# Patient Record
Sex: Female | Born: 1960 | Race: Black or African American | Hispanic: No | Marital: Single | Smoking: Never smoker
Health system: Southern US, Community
[De-identification: ages and names within clinical notes are randomized; demographics above are authoritative.]

## PROBLEM LIST (undated history)

## (undated) DIAGNOSIS — J9621 Acute and chronic respiratory failure with hypoxia: Secondary | ICD-10-CM

## (undated) DIAGNOSIS — G4733 Obstructive sleep apnea (adult) (pediatric): Secondary | ICD-10-CM

## (undated) DIAGNOSIS — Z8679 Personal history of other diseases of the circulatory system: Secondary | ICD-10-CM

## (undated) DIAGNOSIS — G8250 Quadriplegia, unspecified: Secondary | ICD-10-CM

## (undated) DIAGNOSIS — I5032 Chronic diastolic (congestive) heart failure: Secondary | ICD-10-CM

## (undated) HISTORY — PX: TRACHEOSTOMY: SUR1362

## (undated) HISTORY — DX: Obstructive sleep apnea (adult) (pediatric): G47.33

## (undated) HISTORY — DX: Morbid (severe) obesity due to excess calories: E66.01

## (undated) HISTORY — PX: PEG TUBE PLACEMENT: SUR1034

## (undated) HISTORY — DX: Chronic diastolic (congestive) heart failure: I50.32

## (undated) HISTORY — DX: Quadriplegia, unspecified: G82.50

## (undated) HISTORY — DX: Acute and chronic respiratory failure with hypoxia: J96.21

---

## 2018-07-25 ENCOUNTER — Encounter: Payer: Self-pay | Admitting: Internal Medicine

## 2018-07-25 ENCOUNTER — Other Ambulatory Visit: Payer: Self-pay | Admitting: Internal Medicine

## 2018-07-25 DIAGNOSIS — I5032 Chronic diastolic (congestive) heart failure: Secondary | ICD-10-CM

## 2018-07-25 DIAGNOSIS — G4733 Obstructive sleep apnea (adult) (pediatric): Secondary | ICD-10-CM | POA: Insufficient documentation

## 2018-07-25 DIAGNOSIS — J9621 Acute and chronic respiratory failure with hypoxia: Secondary | ICD-10-CM | POA: Insufficient documentation

## 2018-07-25 DIAGNOSIS — Z8679 Personal history of other diseases of the circulatory system: Secondary | ICD-10-CM | POA: Insufficient documentation

## 2018-07-25 DIAGNOSIS — G8250 Quadriplegia, unspecified: Secondary | ICD-10-CM

## 2018-07-25 NOTE — Progress Notes (Signed)
[facility]  [facility]  Date of Service: 07/25/2018  PULMONARY CONSULT   Pamela George  JWJ:[id number]  DOB: 08/05/1960     Referring Physician: Larena GlassmanAmir Firozvi, MD  HPI: Pamela George is a 57 y.o. female seen for Acute on Chronic Respiratory Failure.  Patient is transferred here for further management and weaning.  Right now is on full vent support.  Patient has multiple medical problems including history of type 2 diabetes GERD hypertension lymphedema diastolic heart failure obstructive sleep apnea presented to the hospital with increasing weakness of her upper extremities.  Patient had a CT scan done which showed spinal stenosis from degenerative disc disease.  Patient had paraparesis at that time.  Surgery was asked to see the patient and they deemed her as not a surgical candidate.  Patient was moved to the ICU because of respiratory compromise intubated and eventually a tracheostomy was placed.  She is as a result consistently paraplegic and has been on vent dependent.  Patient was not able to wean and subsequently had a tracheostomy done.  The patient is awake although nonverbal.   Past medical history: Type 2 diabetes GERD Peptic ulcer disease GI bleed Hypertension Lymphedema Chronic diastolic heart failure Obstructive sleep apnea with obesity hypoventilation  Past surgical history: Colostomy Tonsillectomy Adenoidectomy Knee arthroplastic surgery Bowel resection  Allergies Zosyn Penicillin  Social history currently does not smoke or drink or use any drugs  Family history: Unknown  Review of Systems:  ROS performed and is unremarkable other than noted above.  Medications: Reviewed on Rounds  Physical Exam:  Vitals: Temperature 97.2 pulse 86 respiratory 18 blood pressure 122/71 saturations 98%  Ventilator Settings mode of ventilation assist control FiO2 30% tidal volume 408 PEEP 5  . General: Comfortable at this time . Eyes:  Grossly normal lids, irises & conjunctiva . ENT: grossly tongue is normal . Neck: no obvious mass . Cardiovascular: S1-S2 normal no gallop or rub . Respiratory: No rhonchi no rales . Abdomen: Soft and nontender . Skin: no rash seen on limited exam . Musculoskeletal: not rigid . Psychiatric:unable to assess . Neurologic: no seizure no involuntary movements         Labs on Admission:  Laboratory data sodium 150 potassium 3.5 BUN 22 creatinine 0.4 glucose 144 White count 11.2 hemoglobin 11 hematocrit 37.3 platelet count 334 ABG pH 7.537 PCO2 50.3 PO2 86  Radiological Exams on Admission: Radiological studies were reviewed  Assessment/Plan Patient Active Problem List   Diagnosis Date Noted  . Acute on chronic respiratory failure with hypoxia (HCC)   . Quadriplegia and quadriparesis (HCC)   . Obstructive sleep apnea   . Chronic diastolic heart failure (HCC)   . Morbid obesity (HCC)      1. Acute on chronic respiratory failure with hypoxia patient is going to be somewhat of a difficult candidate for weaning because of the cervical disc lesion and we are going to need to check the RSB I and mechanics daily to see if she will be a candidate for T-bar trials.  Spoke with respiratory therapy on rounds and they will assess 2. Quadriplegia acute we will have rehab take a look at her and see if she is a candidate for therapy. 3. Morbid obesity dietary management 4. Obstructive sleep apnea currently is a nonissue because of tracheostomy being in place 5. Chronic diastolic heart failure right now is compensated we will continue with supportive care  I have personally seen and evaluated the patient, evaluated laboratory and  imaging results, formulated the assessment and plan and placed orders. The Patient requires high complexity decision making for assessment and support.  Case was discussed on Rounds with the Respiratory Therapy Staff Time Spent 70minutes  Yevonne PaxSaadat A Fallynn Gravett, MD [facility] Pulmonary  Critical Care Medicine [facility]

## 2018-07-26 ENCOUNTER — Other Ambulatory Visit: Payer: Self-pay | Admitting: Internal Medicine

## 2018-07-26 DIAGNOSIS — G8250 Quadriplegia, unspecified: Secondary | ICD-10-CM

## 2018-07-26 DIAGNOSIS — G4733 Obstructive sleep apnea (adult) (pediatric): Secondary | ICD-10-CM

## 2018-07-26 DIAGNOSIS — J9621 Acute and chronic respiratory failure with hypoxia: Secondary | ICD-10-CM

## 2018-07-26 DIAGNOSIS — I5032 Chronic diastolic (congestive) heart failure: Secondary | ICD-10-CM

## 2018-07-26 NOTE — Progress Notes (Signed)
[facility]  [facility]GRESS NOTE  PULMONARY SERVICE ROUNDS  Date of Service: 07/26/2018  Pamela Museeborah George  DOB: 10/29/1960  Referring physician: Larena GlassmanAmir Firozvi, MD  HPI: Pamela George is a 57 y.o. female  being seen for Acute on Chronic Respiratory Failure.  Patient is comfortable at this time no distress.  The patient remains on the ventilator and full support.  Patient had a RSB I done which was poor and so therefore patient was not attempted on the wean today.  Respiratory therapy is going to reassess later in the afternoon if it is feasible then will be started on the wean.  Review of Systems: Unremarkable other than noted in HPI  All[facility]ies:  [facility]  Medications: Reviewed  Vitals: Temperature 98.9 pulse 89 respiratory rate 27 blood pressure 160/85 saturations 85%  Ventilator Settings: Mode of ventilation assist control FiO2 30% tidal volume 398 PEEP 5  Physical Exam: . General:  calm and comfortable NAD . Eyes: normal lids, irises & conjunctiva . ENT: grossly normal tongue not enlarged . Neck: no masses . Cardiovascular: S1 S2 Normal no rubs no gallop . Respiratory: Scattered rhonchi expansion is equal at this time. . Abdomen: soft non-distended . Skin: no rash seen on limited exam . Musculoskeletal:  no rigidity . Psychiatric: unable to assess . Neurologic: no involuntary movements          Lab Data and radiological Data:  Sodium 143 potassium 3.3 BUN 19 creatinine 0.3  Chest x-ray showed some basilar atelectasis no acute infiltrates   Assessment/Plan  Patient Active Problem List   Diagnosis Date Noted  . Acute on chronic respiratory failure with hypoxia (HCC)   . Quadriplegia and quadriparesis (HCC)   . Obstructive sleep apnea   . Chronic diastolic heart failure (HCC)   . Morbid obesity (HCC)       1. Acute on chronic respiratory failure with hypoxia we will continue to check the respiratory mechanics at this time the patient's  RSB I has been poor and so therefore has not been tolerating the weaning will continue with assist control for now. 2. Quadriplegia quadriparesis therapy as tolerated continue to monitor. 3. Chronic diastolic heart failure compensated right now follow-up x-rays as necessary monitor fluid status 4. Obstructive sleep apnea has tracheostomy in place 5. Morbid obesity at baseline we will continue with supportive care   I have personally evaluated the patient, evaluated the laboratory and imaging results and formulated the assessment and plan and placed orders as needed. The Patient requires high complexity decision making for assessment and support. I have discussed the patient on rounds with the Respiratory Staff   Yevonne PaxSaadat A Khan, MD [facility] Pulmonary Critical Care Medicine

## 2018-07-27 ENCOUNTER — Other Ambulatory Visit: Payer: Self-pay | Admitting: Internal Medicine

## 2018-07-27 DIAGNOSIS — G8250 Quadriplegia, unspecified: Secondary | ICD-10-CM

## 2018-07-27 DIAGNOSIS — J9621 Acute and chronic respiratory failure with hypoxia: Secondary | ICD-10-CM

## 2018-07-27 DIAGNOSIS — G4733 Obstructive sleep apnea (adult) (pediatric): Secondary | ICD-10-CM

## 2018-07-27 DIAGNOSIS — I5032 Chronic diastolic (congestive) heart failure: Secondary | ICD-10-CM

## 2018-07-27 NOTE — Progress Notes (Signed)
[facility]  PROGRESS NOTE  PULMONARY SERVICE ROUNDS  Date of Service: 07/27/2018  Pamela Museeborah George  DOB: 04/17/1961  Referring physician: Larena GlassmanAmir Firozvi, MD  HPI: Pamela MuseDeborah Paolo is a 57 y.o. female  being seen for Acute on Chronic Respiratory Failure.  Patient is resting comfortably right now without distress remains on full support.  Currently is on assist control mode  Review of Systems: Unremarkable other than noted in HPI  Allergies:  [facility]  Medications: Reviewed  Vitals: Temperature 98.4 pulse 84 respiratory rate 18 blood pressure 97/61 saturations 100%  Ventilator Settings: Mode ventilation assist control FiO2 35% tidal line 380 PEEP 5  Physical Exam: . General:  calm and comfortable NAD . Eyes: normal lids, irises & conjunctiva . ENT: grossly normal tongue not enlarged . Neck: no masses . Cardiovascular: S1 S2 Normal no rubs no gallop . Respiratory: No rhonchi no rales are noted . Abdomen: soft non-distended . Skin: no rash seen on limited exam . Musculoskeletal:  no rigidity . Psychiatric: unable to assess . Neurologic: no involuntary movements          Lab Data and radiological Data:  Data reviewed   Assessment/Plan  Patient Active Problem List   Diagnosis Date Noted  . Acute on chronic respiratory failure with hypoxia (HCC)   . Quadriplegia and quadriparesis (HCC)   . Obstructive sleep apnea   . Chronic diastolic heart failure (HCC)   . Morbid obesity (HCC)       1. Acute on chronic respiratory failure with hypoxia we will continue with T collar trials continue pulmonary toilet supportive care 2. Quadriplegia with quadriparesis unchanged we will continue supportive care 3. Obstructive sleep apnea not issue at this time 4. Chronic diastolic heart failure monitor fluid status 5. Morbid obesity at baseline we will continue with present management   I have personally evaluated the patient, evaluated the laboratory and  imaging results and formulated the assessment and plan and placed orders as needed. The Patient requires high complexity decision making for assessment and support. I have discussed the patient on rounds with the Respiratory Staff   Yevonne PaxSaadat A Khan, MD [facility] Pulmonary Critical Care Medicine

## 2018-07-28 ENCOUNTER — Other Ambulatory Visit (HOSPITAL_COMMUNITY): Payer: Medicare Other | Admitting: Internal Medicine

## 2018-07-28 DIAGNOSIS — G4733 Obstructive sleep apnea (adult) (pediatric): Secondary | ICD-10-CM | POA: Diagnosis not present

## 2018-07-28 DIAGNOSIS — I5032 Chronic diastolic (congestive) heart failure: Secondary | ICD-10-CM

## 2018-07-28 DIAGNOSIS — G8250 Quadriplegia, unspecified: Secondary | ICD-10-CM

## 2018-07-28 DIAGNOSIS — J9621 Acute and chronic respiratory failure with hypoxia: Secondary | ICD-10-CM

## 2018-07-28 NOTE — Progress Notes (Signed)
[facility]  PROGRESS NOTE  PULMONARY SERVICE ROUNDS  Date of Service: 07/28/2018  Pamela Museeborah George  DOB: 05/11/1961  Referring physician: Larena GlassmanAmir Firozvi, MD  HPI: Pamela George is a 57 y.o. female  being seen for Acute on Chronic Respiratory Failure.  Comfortable right now without distress.  Patient is on the ventilator assist control mode  Review of Systems: Unremarkable other than noted in HPI  Allergies:  [facility] the [facility]  Medications: Reviewed  Vitals: Pitcher 96.3 pulse 60 respiratory 14 blood pressure 120/66 saturations 100%  Ventilator Settings: Mode of ventilation assist control tidal volume 400 PEEP is 5  Physical Exam: . General:  calm and comfortable NAD . Eyes: normal lids, irises & conjunctiva . ENT: grossly normal tongue not enlarged . Neck: no masses . Cardiovascular: S1 S2 Normal no rubs no gallop . Respiratory: No rhonchi or rales are noted at this time . Abdomen: soft non-distended . Skin: no rash seen on limited exam . Musculoskeletal:  no rigidity . Psychiatric: unable to assess . Neurologic: no involuntary movements          Lab Data and radiological Data:  White count 6.8 hemoglobin 9.5 hematocrit 31.2 platelet count 259   Assessment/Plan  Patient Active Problem List   Diagnosis Date Noted  . Acute on chronic respiratory failure with hypoxia (HCC)   . Quadriplegia and quadriparesis (HCC)   . Obstructive sleep apnea   . Chronic diastolic heart failure (HCC)   . Morbid obesity (HCC)       1. Acute on chronic respiratory failure with hypoxia we will continue with the full vent support continue to check the breathing trials patient's trials have been failing right now.  We will continue to assess. 2. Quadriplegia supportive care therapy as tolerated 3. Sleep apnea nonissue 4. Chronic diastolic heart failure monitor fluid status 5. Morbid obesity at baseline we will continue with supportive care   I have personally  evaluated the patient, evaluated the laboratory and imaging results and formulated the assessment and plan and placed orders as needed. The Patient requires high complexity decision making for assessment and support. I have discussed the patient on rounds with the Respiratory Staff   Yevonne PaxSaadat A , MD [facility] Pulmonary Critical Care Medicine

## 2018-07-30 ENCOUNTER — Other Ambulatory Visit (HOSPITAL_COMMUNITY): Payer: Medicare Other | Admitting: Internal Medicine

## 2018-07-30 DIAGNOSIS — J9621 Acute and chronic respiratory failure with hypoxia: Secondary | ICD-10-CM

## 2018-07-30 DIAGNOSIS — G4733 Obstructive sleep apnea (adult) (pediatric): Secondary | ICD-10-CM

## 2018-07-30 DIAGNOSIS — I5032 Chronic diastolic (congestive) heart failure: Secondary | ICD-10-CM | POA: Diagnosis not present

## 2018-07-30 DIAGNOSIS — G8250 Quadriplegia, unspecified: Secondary | ICD-10-CM

## 2018-07-30 NOTE — Progress Notes (Signed)
[facility]  PROGRESS NOTE  PULMONARY SERVICE ROUNDS  Date of Service: 07/30/2018  Pamela George  DOB: 1960/08/20  Referring physician: Larena Glassman, MD  HPI: Pamela George is a 58 y.o. female  being seen for Acute on Chronic Respiratory Failure.  Patient is on full support right now on assist control mode the mechanics have been poor not able to tolerate weaning  Review of Systems: Unremarkable other than noted in HPI  Allergies:  Reviewed on the [facility]  Medications: Reviewed  Vitals: Temperature 98.0 pulse 67 respiratory 23 blood pressure 125/69 saturations 99%  Ventilator Settings: Mode of ventilation assist control FiO2 30% tidal volume 380 PEEP 5  Physical Exam: . General:  calm and comfortable NAD . Eyes: normal lids, irises & conjunctiva . ENT: grossly normal tongue not enlarged . Neck: no masses . Cardiovascular: S1 S2 Normal no rubs no gallop . Respiratory: No rhonchi or rales are noted at this time . Abdomen: soft non-distended . Skin: no rash seen on limited exam . Musculoskeletal:  no rigidity . Psychiatric: unable to assess . Neurologic: no involuntary movements          Lab Data and radiological Data:  Sodium 141 potassium 4.1 BUN 7 creatinine 0.3   Assessment/Plan  Patient Active Problem List   Diagnosis Date Noted  . Acute on chronic respiratory failure with hypoxia (HCC)   . Quadriplegia and quadriparesis (HCC)   . Obstructive sleep apnea   . Chronic diastolic heart failure (HCC)   . Morbid obesity (HCC)       1. Acute on chronic respiratory failure with hypoxia we will continue with assessing the mechanics patient's chances of weaning her fairly poor 2. Quadriplegia quadriparesis continue with supportive care. 3. Sleep apnea at baseline 4. Chronic diastolic heart failure at baseline with being on the ventilator 5. Morbid obesity at baseline we will continue with supportive care   I have personally evaluated the patient,  evaluated the laboratory and imaging results and formulated the assessment and plan and placed orders as needed. The Patient requires high complexity decision making for assessment and support. I have discussed the patient on rounds with the Respiratory Staff   Yevonne Pax, MD [facility] Pulmonary Critical Care Medicine

## 2018-07-31 ENCOUNTER — Other Ambulatory Visit (HOSPITAL_COMMUNITY): Payer: Medicare Other | Admitting: Internal Medicine

## 2018-07-31 DIAGNOSIS — I5032 Chronic diastolic (congestive) heart failure: Secondary | ICD-10-CM | POA: Diagnosis not present

## 2018-07-31 DIAGNOSIS — J9621 Acute and chronic respiratory failure with hypoxia: Secondary | ICD-10-CM

## 2018-07-31 DIAGNOSIS — G4733 Obstructive sleep apnea (adult) (pediatric): Secondary | ICD-10-CM

## 2018-07-31 DIAGNOSIS — G8250 Quadriplegia, unspecified: Secondary | ICD-10-CM

## 2018-07-31 NOTE — Progress Notes (Signed)
[facility]  PROGRESS NOTE  PULMONARY SERVICE ROUNDS  Date of Service: 07/31/2018  Pamela George  DOB: 08-17-1960  Referring physician: Larena Glassman, MD  HPI: Pamela George is a 58 y.o. female  being seen for Acute on Chronic Respiratory Failure.  She is awake comfortable without distress.  Spoke to her explained to her that we can try her on a spontaneous breathing trial and try to begin more aggressive weaning if she is able to tolerate.  Review of Systems: Unremarkable other than noted in HPI  Allergies:  Reviewed on the [facility]  Medications: Reviewed  Vitals: Temperature 97.8 pulse 72 respiratory rate 14 blood pressure 120/72 saturations 100%  Ventilator Settings: Mode of ventilation assist control FiO2 30% tidal volume 360 PEEP 5  Physical Exam: . General:  calm and comfortable NAD . Eyes: normal lids, irises & conjunctiva . ENT: grossly normal tongue not enlarged . Neck: no masses . Cardiovascular: S1 S2 Normal no rubs no gallop . Respiratory: No rhonchi or rales are noted at this time . Abdomen: soft non-distended . Skin: no rash seen on limited exam . Musculoskeletal:  no rigidity . Psychiatric: unable to assess . Neurologic: no involuntary movements          Lab Data and radiological Data:  White count 5.0 hemoglobin 10.8 hematocrit 35.1 platelet count 266 Sodium 135 potassium 4.3 BUN 9 creatinine 0.3   Assessment/Plan  Patient Active Problem List   Diagnosis Date Noted  . Acute on chronic respiratory failure with hypoxia (HCC)   . Quadriplegia and quadriparesis (HCC)   . Obstructive sleep apnea   . Chronic diastolic heart failure (HCC)   . Morbid obesity (HCC)       1. Acute on chronic respiratory failure with hypoxia we will continue with resting mode of assist control however we are going to place her on spontaneous breathing trial today and try to advance weaning on T-bar trials if possible. 2. Quadriplegia quadriparesis we will  continue with supportive care she is inoperable 3. Obstructive sleep apnea nonissue 4. Chronic diastolic heart failure monitor fluid status. 5. Morbid obesity continue with present management   I have personally evaluated the patient, evaluated the laboratory and imaging results and formulated the assessment and plan and placed orders as needed. The Patient requires high complexity decision making for assessment and support. I have discussed the patient on rounds with the Respiratory Staff   Yevonne Pax, MD [facility] Pulmonary Critical Care Medicine

## 2018-08-01 ENCOUNTER — Other Ambulatory Visit (HOSPITAL_COMMUNITY): Payer: Medicare Other | Admitting: Internal Medicine

## 2018-08-01 DIAGNOSIS — I5032 Chronic diastolic (congestive) heart failure: Secondary | ICD-10-CM

## 2018-08-01 DIAGNOSIS — G4733 Obstructive sleep apnea (adult) (pediatric): Secondary | ICD-10-CM | POA: Diagnosis not present

## 2018-08-01 DIAGNOSIS — J9621 Acute and chronic respiratory failure with hypoxia: Secondary | ICD-10-CM | POA: Diagnosis not present

## 2018-08-01 DIAGNOSIS — G8250 Quadriplegia, unspecified: Secondary | ICD-10-CM

## 2018-08-01 NOTE — Progress Notes (Signed)
[facility]  PROGRESS NOTE  PULMONARY SERVICE ROUNDS  Date of Service: 08/01/2018  Pamela George  DOB: 1960-11-23  Referring physician: Larena Glassman, MD  HPI: Pamela George is a 58 y.o. female  being seen for Acute on Chronic Respiratory Failure.  She is doing well is been weaning on pressure support 10/5 spoke with respiratory therapy and we are going to place her on T collar trial today  Review of Systems: Unremarkable other than noted in HPI  Allergies:  Reviewed on the [facility]  Medications: Reviewed  Vitals: Temperature 98.7 pulse 65 respiratory 16 saturations 98%  Ventilator Settings: Mode of ventilation pressure support FiO2 30% tidal volume 390 PEEP 5 pressure support 10  Physical Exam: . General:  calm and comfortable NAD . Eyes: normal lids, irises & conjunctiva . ENT: grossly normal tongue not enlarged . Neck: no masses . Cardiovascular: S1 S2 Normal no rubs no gallop . Respiratory: Scattered rhonchi expansion is equal . Abdomen: soft non-distended . Skin: no rash seen on limited exam . Musculoskeletal:  no rigidity . Psychiatric: unable to assess . Neurologic: no involuntary movements          Lab Data and radiological Data:  No labs to report today   Assessment/Plan  Patient Active Problem List   Diagnosis Date Noted  . Acute on chronic respiratory failure with hypoxia (HCC)   . Quadriplegia and quadriparesis (HCC)   . Obstructive sleep apnea   . Chronic diastolic heart failure (HCC)   . Morbid obesity (HCC)       1. Acute on chronic respiratory failure with hypoxia we will continue with the pressure support wean on pressure support 10 PEEP 5 and then begin the T collar wean as discussed during rounds. 2. Quadriplegia quadriparesis unchanged we will continue with supportive care 3. Sleep apnea nonissue 4. Chronic diastolic heart failure currently is compensated 5. Morbid obesity we will continue with supportive care dietary  management   I have personally evaluated the patient, evaluated the laboratory and imaging results and formulated the assessment and plan and placed orders as needed. The Patient requires high complexity decision making for assessment and support. I have discussed the patient on rounds with the Respiratory Staff   Yevonne Pax, MD [facility] Pulmonary Critical Care Medicine

## 2018-08-03 ENCOUNTER — Other Ambulatory Visit (HOSPITAL_COMMUNITY): Payer: Medicare Other | Admitting: Internal Medicine

## 2018-08-03 DIAGNOSIS — G4733 Obstructive sleep apnea (adult) (pediatric): Secondary | ICD-10-CM

## 2018-08-03 DIAGNOSIS — J9621 Acute and chronic respiratory failure with hypoxia: Secondary | ICD-10-CM | POA: Diagnosis not present

## 2018-08-03 DIAGNOSIS — I5032 Chronic diastolic (congestive) heart failure: Secondary | ICD-10-CM

## 2018-08-03 DIAGNOSIS — G8250 Quadriplegia, unspecified: Secondary | ICD-10-CM

## 2018-08-03 NOTE — Progress Notes (Signed)
[facility]  PROGRESS NOTE  PULMONARY SERVICE ROUNDS  Date of Service: 08/03/2018  Pamela George  DOB: 1961-04-06  Referring physician: Larena Glassman, MD  HPI: Pamela George is a 58 y.o. female  being seen for Acute on Chronic Respiratory Failure.  She has been tolerating her weaning so far she is back on T collar this morning she was requiring 30% FiO2  Review of Systems: Unremarkable other than noted in HPI  Allergies:  Reviewed on the [facility]  Medications: Reviewed  Vitals: Temperature 97.8 pulse 73 respiratory rate 14 blood pressure 110/80 saturations 100%  Ventilator Settings: Off the ventilator on T collar currently  Physical Exam: . General:  calm and comfortable NAD . Eyes: normal lids, irises & conjunctiva . ENT: grossly normal tongue not enlarged . Neck: no masses . Cardiovascular: S1 S2 Normal no rubs no gallop . Respiratory: Scattered rhonchi expansion is equal . Abdomen: soft non-distended . Skin: no rash seen on limited exam . Musculoskeletal:  no rigidity . Psychiatric: unable to assess . Neurologic: no involuntary movements          Lab Data and radiological Data:  No labs to report today   Assessment/Plan  Patient Active Problem List   Diagnosis Date Noted  . Acute on chronic respiratory failure with hypoxia (HCC)   . Quadriplegia and quadriparesis (HCC)   . Obstructive sleep apnea   . Chronic diastolic heart failure (HCC)   . Morbid obesity (HCC)       1. Acute on chronic respiratory failure with hypoxia we will continue with T collar weaning as ordered per protocol.  Continue pulmonary toilet supportive care 2. Quadriplegia at baseline 3. Obstructive sleep apnea has trach in place 4. Chronic diastolic heart failure currently is compensated 5. We will obesity at baseline   I have personally evaluated the patient, evaluated the laboratory and imaging results and formulated the assessment and plan and placed orders as  needed. The Patient requires high complexity decision making for assessment and support. I have discussed the patient on rounds with the Respiratory Staff   Yevonne Pax, MD [facility] Pulmonary Critical Care Medicine

## 2018-08-04 ENCOUNTER — Other Ambulatory Visit (HOSPITAL_COMMUNITY): Payer: Medicare Other | Admitting: Internal Medicine

## 2018-08-04 DIAGNOSIS — G8250 Quadriplegia, unspecified: Secondary | ICD-10-CM

## 2018-08-04 DIAGNOSIS — J9621 Acute and chronic respiratory failure with hypoxia: Secondary | ICD-10-CM

## 2018-08-04 DIAGNOSIS — G4733 Obstructive sleep apnea (adult) (pediatric): Secondary | ICD-10-CM

## 2018-08-04 DIAGNOSIS — I5032 Chronic diastolic (congestive) heart failure: Secondary | ICD-10-CM | POA: Diagnosis not present

## 2018-08-04 NOTE — Progress Notes (Signed)
[facility]  PROGRESS NOTE  PULMONARY SERVICE ROUNDS  Date of Service: 08/04/2018  Pamela George  DOB: Aug 25, 1960  Referring physician: Larena Glassman, MD  HPI: Pamela George is a 58 y.o. female  being seen for Acute on Chronic Respiratory Failure.  Comfortable right now without distress remains on T collar the goal is weaning for 24 hours doing well so far  Review of Systems: Unremarkable other than noted in HPI  Allergies:  Reviewed on the [facility]  Medications: Reviewed  Vitals: Temperature 97.4 pulse 76 respiratory 14 blood pressure 109/59 saturations 99%  Ventilator Settings: Off the ventilator on T collar currently  Physical Exam: . General:  calm and comfortable NAD . Eyes: normal lids, irises & conjunctiva . ENT: grossly normal tongue not enlarged . Neck: no masses . Cardiovascular: S1 S2 Normal no rubs no gallop . Respiratory: No rhonchi no rales . Abdomen: soft non-distended . Skin: no rash seen on limited exam . Musculoskeletal:  no rigidity . Psychiatric: unable to assess . Neurologic: no involuntary movements          Lab Data and radiological Data:  No labs today   Assessment/Plan  Patient Active Problem List   Diagnosis Date Noted  . Acute on chronic respiratory failure with hypoxia (HCC)   . Quadriplegia and quadriparesis (HCC)   . Obstructive sleep apnea   . Chronic diastolic heart failure (HCC)   . Morbid obesity (HCC)       1. Acute on chronic respiratory failure with hypoxia we will continue to wean 24-hour goal on T collar. 2. Quadriplegia at baseline we will continue with supportive care therapy as tolerated 3. Obstructive sleep apnea has tracheostomy in place 4. Chronic diastolic heart failure compensated 5. Morbid obesity stable at this time   I have personally evaluated the patient, evaluated the laboratory and imaging results and formulated the assessment and plan and placed orders as needed. The Patient requires  high complexity decision making for assessment and support. I have discussed the patient on rounds with the Respiratory Staff   Yevonne Pax, MD [facility] Pulmonary Critical Care Medicine

## 2018-08-05 ENCOUNTER — Other Ambulatory Visit (HOSPITAL_COMMUNITY): Payer: Medicare Other | Admitting: Internal Medicine

## 2018-08-05 DIAGNOSIS — J9621 Acute and chronic respiratory failure with hypoxia: Secondary | ICD-10-CM | POA: Diagnosis not present

## 2018-08-05 DIAGNOSIS — G8250 Quadriplegia, unspecified: Secondary | ICD-10-CM

## 2018-08-05 DIAGNOSIS — I5032 Chronic diastolic (congestive) heart failure: Secondary | ICD-10-CM

## 2018-08-05 DIAGNOSIS — G4733 Obstructive sleep apnea (adult) (pediatric): Secondary | ICD-10-CM

## 2018-08-05 NOTE — Progress Notes (Signed)
[facility]  PROGRESS NOTE  PULMONARY SERVICE ROUNDS  Date of Service: 08/05/2018  [name]  DOB: [birth year]  Referring physician: Larena Glassman, MD  HPI: Pamela George is a 58 y.o. female  being seen for Acute on Chronic Respiratory Failure.  Patient is comfortable right now without distress is on assist control mode currently on 35% FiO2.  Overnight apparently patient was placed back on the ventilator had completed 18 hours based on this center level of injury she may require some form of nocturnal vent support for we will see how she does as we continue to wean  Review of Systems: Unremarkable other than noted in HPI  Allergies:  Reviewed on the [facility]  Medications: Reviewed  Vitals: Temperature 98.2 pulse 86 respiratory 14 blood pressure 118/69 saturations 99%  Ventilator Settings: Mode of ventilation assist control FiO2 35% tidal volume 427 PEEP 5  Physical Exam: . General:  calm and comfortable NAD . Eyes: normal lids, irises & conjunctiva . ENT: grossly normal tongue not enlarged . Neck: no masses . Cardiovascular: S1 S2 Normal no rubs no gallop . Respiratory: No rhonchi or rales are noted . Abdomen: soft non-distended . Skin: no rash seen on limited exam . Musculoskeletal:  no rigidity . Psychiatric: unable to assess . Neurologic: no involuntary movements          Lab Data and radiological Data:  White count 10.5 hemoglobin 11.2 hematocrit 38.1 platelet count 427 Sodium 138 potassium 4.5 BUN 11 creatinine 0.3   Assessment/Plan  Patient Active Problem List   Diagnosis Date Noted  . Acute on chronic respiratory failure with hypoxia (HCC)   . Quadriplegia and quadriparesis (HCC)   . Obstructive sleep apnea   . Chronic diastolic heart failure (HCC)   . Morbid obesity (HCC)       1. Acute on chronic respiratory failure with hypoxia we will continue with weaning on assist control mode right now and will switch her over to check the RSB I  then try to resume the T collar as she was tolerating it fairly well. 2. Quadriplegia quadriparesis we will continue with supportive care. 3. Obstructive sleep apnea nonissue 4. Chronic diastolic heart failure at baseline 5. Morbid obesity we will continue supportive care   I have personally evaluated the patient, evaluated the laboratory and imaging results and formulated the assessment and plan and placed orders as needed. The Patient requires high complexity decision making for assessment and support. I have discussed the patient on rounds with the Respiratory Staff   Yevonne Pax, MD [facility] Pulmonary Critical Care Medicine

## 2018-08-06 ENCOUNTER — Other Ambulatory Visit (HOSPITAL_COMMUNITY): Payer: Medicare Other | Admitting: Internal Medicine

## 2018-08-06 DIAGNOSIS — I5032 Chronic diastolic (congestive) heart failure: Secondary | ICD-10-CM

## 2018-08-06 DIAGNOSIS — G4733 Obstructive sleep apnea (adult) (pediatric): Secondary | ICD-10-CM | POA: Diagnosis not present

## 2018-08-06 DIAGNOSIS — G8250 Quadriplegia, unspecified: Secondary | ICD-10-CM

## 2018-08-06 DIAGNOSIS — J9621 Acute and chronic respiratory failure with hypoxia: Secondary | ICD-10-CM

## 2018-08-06 NOTE — Progress Notes (Signed)
[facility]  PROGRESS NOTE  PULMONARY SERVICE ROUNDS  Date of Service: 08/06/2018  Pamela George  DOB: 01/01/1961  Referring physician: Larena Glassman, MD  HPI: Pamela George is a 58 y.o. female  being seen for Acute on Chronic Respiratory Failure.  This morning the patient was on the ventilator the RSB I was poor she apparently has fatigue significantly since her last 18-hour wean on T collar  Review of Systems: Unremarkable other than noted in HPI  Allergies:  Reviewed on the [facility]  Medications: Reviewed  Vitals: Temperature 97.9 pulse 82 respiratory rate 16 blood pressure 134/68 saturations 100%  Ventilator Settings: Mode of ventilation assist control FiO2 35% PEEP 5  Physical Exam: . General:  calm and comfortable NAD . Eyes: normal lids, irises & conjunctiva . ENT: grossly normal tongue not enlarged . Neck: no masses . Cardiovascular: S1 S2 Normal no rubs no gallop . Respiratory: No rhonchi or rales are noted at this time . Abdomen: soft non-distended . Skin: no rash seen on limited exam . Musculoskeletal:  no rigidity . Psychiatric: unable to assess . Neurologic: no involuntary movements          Lab Data and radiological Data:  No labs to report today   Assessment/Plan  Patient Active Problem List   Diagnosis Date Noted  . Acute on chronic respiratory failure with hypoxia (HCC)   . Quadriplegia and quadriparesis (HCC)   . Obstructive sleep apnea   . Chronic diastolic heart failure (HCC)   . Morbid obesity (HCC)       1. Acute on chronic respiratory failure with hypoxia we will continue with the full vent support at this time.  Respiratory therapy is going to check the RSB I see if patient is able to wean again. 2. Quadriplegia continue with supportive care because of the high lesion she may actually require nocturnal support as has been previously discussed 3. Structures sleep apnea nonissue with trach 4. Chronic diastolic heart  failure continue to monitor radiologically no recent chest x-ray would recommend getting a follow-up chest film 5. Morbid obesity at baseline we will continue present management   I have personally evaluated the patient, evaluated the laboratory and imaging results and formulated the assessment and plan and placed orders as needed. The Patient requires high complexity decision making for assessment and support. I have discussed the patient on rounds with the Respiratory Staff   Yevonne Pax, MD [facility] Pulmonary Critical Care Medicine

## 2018-08-07 ENCOUNTER — Other Ambulatory Visit (HOSPITAL_COMMUNITY): Payer: Medicare Other | Admitting: Internal Medicine

## 2018-08-07 DIAGNOSIS — J9621 Acute and chronic respiratory failure with hypoxia: Secondary | ICD-10-CM

## 2018-08-07 DIAGNOSIS — I5032 Chronic diastolic (congestive) heart failure: Secondary | ICD-10-CM

## 2018-08-07 DIAGNOSIS — G4733 Obstructive sleep apnea (adult) (pediatric): Secondary | ICD-10-CM

## 2018-08-07 DIAGNOSIS — G8250 Quadriplegia, unspecified: Secondary | ICD-10-CM

## 2018-08-07 NOTE — Progress Notes (Signed)
[facility]  PROGRESS NOTE  PULMONARY SERVICE ROUNDS  Date of Service: 08/07/2018  Pamela George  DOB: [birth year]  Referring physician: Larena Glassman, MD  HPI: Pamela George is a 58 y.o. female  being seen for Acute on Chronic Respiratory Failure.  Patient right now is on full support on assist control mode has been on 30% FiO2 good saturations are noted  Review of Systems: Unremarkable other than noted in HPI  Allergies:  Reviewed on the [facility]  Medications: Reviewed  Vitals: Temperature 98.3 pulse 87 respiratory 14 blood pressure 162/90 saturations 98%  Ventilator Settings: Mode of ventilation assist control FiO2 30% tidal volume 396 PEEP 5  Physical Exam: . General:  calm and comfortable NAD . Eyes: normal lids, irises & conjunctiva . ENT: grossly normal tongue not enlarged . Neck: no masses . Cardiovascular: S1 S2 Normal no rubs no gallop . Respiratory: Coarse breath sounds no rhonchi . Abdomen: soft non-distended . Skin: no rash seen on limited exam . Musculoskeletal:  no rigidity . Psychiatric: unable to assess . Neurologic: no involuntary movements          Lab Data and radiological Data:  No labs to report today No recent chest x-ray done   Assessment/Plan  Patient Active Problem List   Diagnosis Date Noted  . Acute on chronic respiratory failure with hypoxia (HCC)   . Quadriplegia and quadriparesis (HCC)   . Obstructive sleep apnea   . Chronic diastolic heart failure (HCC)   . Morbid obesity (HCC)       1. Acute on chronic respiratory failure with hypoxia she had been tolerating the wean few days ago however since then her our SBI has been poor and that she has not been able to tolerate weaning at all.  I have recommended that we get a follow-up chest x-ray she has not had a chest x-ray since December 27.  Last chest x-ray had shown little bit of blunting of the left costophrenic angle 2. Quadriplegia we will continue with supportive  care physical therapy as tolerated she basically has no movement in her limbs at this point 3. Sleep apnea nonissue 4. Chronic diastolic heart failure as noted we will follow-up chest x-ray 5. Morbid obesity at baseline continue present management   I have personally evaluated the patient, evaluated the laboratory and imaging results and formulated the assessment and plan and placed orders as needed.  Time spent 35 minutes extended discussion with the primary care team The Patient requires high complexity decision making for assessment and support. I have discussed the patient on rounds with the Respiratory Staff   Yevonne Pax, MD [facility] Pulmonary Critical Care Medicine

## 2018-08-08 ENCOUNTER — Other Ambulatory Visit (HOSPITAL_COMMUNITY): Payer: Medicare Other | Admitting: Internal Medicine

## 2018-08-08 DIAGNOSIS — G4733 Obstructive sleep apnea (adult) (pediatric): Secondary | ICD-10-CM | POA: Diagnosis not present

## 2018-08-08 DIAGNOSIS — I5032 Chronic diastolic (congestive) heart failure: Secondary | ICD-10-CM | POA: Diagnosis not present

## 2018-08-08 DIAGNOSIS — G8250 Quadriplegia, unspecified: Secondary | ICD-10-CM

## 2018-08-08 DIAGNOSIS — J9621 Acute and chronic respiratory failure with hypoxia: Secondary | ICD-10-CM | POA: Diagnosis not present

## 2018-08-08 NOTE — Progress Notes (Signed)
[facility]  PROGRESS NOTE  PULMONARY SERVICE ROUNDS  Date of Service: 08/08/2018  Pamela George  DOB: 08-13-1960  Referring physician: Larena Glassman, MD  HPI: Pamela George is a 58 y.o. female  being seen for Acute on Chronic Respiratory Failure.  Patient is on full support.  Respiratory therapy is going to check her RSB I today  Review of Systems: Unremarkable other than noted in HPI  Allergies:  Reviewed on the [facility]  Medications: Reviewed  Vitals: Temperature 97.5 pulse 63 respiratory 14 blood pressure 130/80 saturations 100%  Ventilator Settings: On the ventilator for mode of ventilation assist control FiO2 30% tidal volume 392 PEEP 5  Physical Exam: . General:  calm and comfortable NAD . Eyes: normal lids, irises & conjunctiva . ENT: grossly normal tongue not enlarged . Neck: no masses . Cardiovascular: S1 S2 Normal no rubs no gallop . Respiratory: Scattered rhonchi expansion is equal . Abdomen: soft non-distended . Skin: no rash seen on limited exam . Musculoskeletal:  no rigidity . Psychiatric: unable to assess . Neurologic: no involuntary movements          Lab Data and radiological Data:  No labs to report   Assessment/Plan  Patient Active Problem List   Diagnosis Date Noted  . Acute on chronic respiratory failure with hypoxia (HCC)   . Quadriplegia and quadriparesis (HCC)   . Obstructive sleep apnea   . Chronic diastolic heart failure (HCC)   . Morbid obesity (HCC)       1. Acute on chronic respiratory failure with hypoxia we will check the RSB I today and try to begin weaning again right now is on assist control mode on 30% FiO2 she looks better today 2. Quadriplegia we will continue with supportive care 3. Obstructive sleep apnea nonissue 4. Chronic diastolic heart failure monitor fluids get follow-up chest x-ray is chest 5. Morbid obesity at baseline   I have personally evaluated the patient, evaluated the laboratory and  imaging results and formulated the assessment and plan and placed orders as needed. The Patient requires high complexity decision making for assessment and support. I have discussed the patient on rounds with the Respiratory Staff   Yevonne Pax, MD [facility] Pulmonary Critical Care Medicine

## 2018-08-09 ENCOUNTER — Other Ambulatory Visit (HOSPITAL_COMMUNITY): Payer: Medicare Other | Admitting: Internal Medicine

## 2018-08-09 DIAGNOSIS — G8250 Quadriplegia, unspecified: Secondary | ICD-10-CM

## 2018-08-09 DIAGNOSIS — G4733 Obstructive sleep apnea (adult) (pediatric): Secondary | ICD-10-CM | POA: Diagnosis not present

## 2018-08-09 DIAGNOSIS — J9621 Acute and chronic respiratory failure with hypoxia: Secondary | ICD-10-CM | POA: Diagnosis not present

## 2018-08-09 DIAGNOSIS — I5032 Chronic diastolic (congestive) heart failure: Secondary | ICD-10-CM | POA: Diagnosis not present

## 2018-08-09 NOTE — Progress Notes (Signed)
[facility]  PROGRESS NOTE  PULMONARY SERVICE ROUNDS  Date of Service: 08/09/2018  Pamela George  DOB: 1961-07-23  Referring physician: Larena Glassman, MD  HPI: Pamela George is a 58 y.o. female  being seen for Acute on Chronic Respiratory Failure.  Patient is on T collar today she is weaning yesterday she was not able to wean.  She does have issues with anxiety and therapist observed that as long as she gets her anxiety medications she seems to do better with the weaning on the T collar.  Review of Systems: Unremarkable other than noted in HPI  Allergies:  Reviewed on the [facility]  Medications: Reviewed  Vitals: Temperature 97.0 pulse 65 respiratory 14 blood pressure 120/80 saturations 99%  Ventilator Settings: Off the ventilator on T collar right now  Physical Exam: . General:  calm and comfortable NAD . Eyes: normal lids, irises & conjunctiva . ENT: grossly normal tongue not enlarged . Neck: no masses . Cardiovascular: S1 S2 Normal no rubs no gallop . Respiratory: No rhonchi or rales are noted at this time . Abdomen: soft non-distended . Skin: no rash seen on limited exam . Musculoskeletal:  no rigidity . Psychiatric: unable to assess . Neurologic: no involuntary movements          Lab Data and radiological Data:  No labs today   Assessment/Plan  Patient Active Problem List   Diagnosis Date Noted  . Acute on chronic respiratory failure with hypoxia (HCC)   . Quadriplegia and quadriparesis (HCC)   . Obstructive sleep apnea   . Chronic diastolic heart failure (HCC)   . Morbid obesity (HCC)       1. Acute on chronic respiratory failure with hypoxia patient currently is on T collar has been requiring 35% FiO2 we will continue with secretion management supportive care 2. Quadriplegia at baseline patient will be followed by therapy 3. Sleep apnea nonissue 4. Chronic diastolic heart failure at baseline 5. Morbid obesity continue with supportive  care dietary management   I have personally evaluated the patient, evaluated the laboratory and imaging results and formulated the assessment and plan and placed orders as needed. The Patient requires high complexity decision making for assessment and support. I have discussed the patient on rounds with the Respiratory Staff   Yevonne Pax, MD [facility] Pulmonary Critical Care Medicine

## 2018-08-15 ENCOUNTER — Other Ambulatory Visit (HOSPITAL_COMMUNITY): Payer: Medicare Other | Admitting: Internal Medicine

## 2018-08-15 DIAGNOSIS — G4733 Obstructive sleep apnea (adult) (pediatric): Secondary | ICD-10-CM

## 2018-08-15 DIAGNOSIS — J9621 Acute and chronic respiratory failure with hypoxia: Secondary | ICD-10-CM | POA: Diagnosis not present

## 2018-08-15 DIAGNOSIS — G8250 Quadriplegia, unspecified: Secondary | ICD-10-CM

## 2018-08-15 DIAGNOSIS — I5032 Chronic diastolic (congestive) heart failure: Secondary | ICD-10-CM

## 2018-08-15 NOTE — Progress Notes (Signed)
[facility]  PROGRESS NOTE  PULMONARY SERVICE ROUNDS  Date of Service: 08/15/2018  Pamela George  DOB: July 27, 1961  Referring physician: Larena Glassman, MD  HPI: Pamela George is a 58 y.o. female  being seen for Acute on Chronic Respiratory Failure.  Patient is on full support at this time she is failed the RSB I numerous times right now is on assist control mode on 25% FiO2  Review of Systems: Unremarkable other than noted in HPI  Allergies:  Reviewed on the [facility]  Medications: Reviewed  Vitals: Temperature 98.0 pulse 63 respiratory 20 blood pressure 152/89 saturations 98%  Ventilator Settings: Mode of ventilation assist control FiO2 25% tidal volume 4 2 PEEP 5  Physical Exam: . General:  calm and comfortable NAD . Eyes: normal lids, irises & conjunctiva . ENT: grossly normal tongue not enlarged . Neck: no masses . Cardiovascular: S1 S2 Normal no rubs no gallop . Respiratory: No rhonchi or rales are noted at this time . Abdomen: soft non-distended . Skin: no rash seen on limited exam . Musculoskeletal:  no rigidity . Psychiatric: unable to assess . Neurologic: no involuntary movements          Lab Data and radiological Data:  Data has been reviewed   Assessment/Plan  Patient Active Problem List   Diagnosis Date Noted  . Acute on chronic respiratory failure with hypoxia (HCC)   . Quadriplegia and quadriparesis (HCC)   . Obstructive sleep apnea   . Chronic diastolic heart failure (HCC)   . Morbid obesity (HCC)       1. Acute on chronic respiratory failure with hypoxia we will continue with full support on assist control titrate oxygen as tolerated more than likely patient is going to be vent dependent and not be able to be weaned off the ventilator. 2. Quadriplegia at baseline appears to be worsening 3. Chronic diastolic heart failure diuretics as tolerated 4. Morbid obesity stable 5. Sleep apnea nonissue at this time   I have personally  evaluated the patient, evaluated the laboratory and imaging results and formulated the assessment and plan and placed orders as needed. The Patient requires high complexity decision making for assessment and support. I have discussed the patient on rounds with the Respiratory Staff   Yevonne Pax, MD [facility] Pulmonary Critical Care Medicine

## 2018-08-16 ENCOUNTER — Other Ambulatory Visit (HOSPITAL_COMMUNITY): Payer: Medicare Other | Admitting: Internal Medicine

## 2018-08-16 DIAGNOSIS — J9621 Acute and chronic respiratory failure with hypoxia: Secondary | ICD-10-CM | POA: Diagnosis not present

## 2018-08-16 DIAGNOSIS — G8250 Quadriplegia, unspecified: Secondary | ICD-10-CM

## 2018-08-16 DIAGNOSIS — G4733 Obstructive sleep apnea (adult) (pediatric): Secondary | ICD-10-CM | POA: Diagnosis not present

## 2018-08-16 DIAGNOSIS — I5032 Chronic diastolic (congestive) heart failure: Secondary | ICD-10-CM | POA: Diagnosis not present

## 2018-08-16 NOTE — Progress Notes (Signed)
[facility]  PROGRESS NOTE  PULMONARY SERVICE ROUNDS  Date of Service: 08/16/2018  Pamela George  DOB: 09/20/1960  Referring physician: Larena Glassman, MD  HPI: Pamela George is a 58 y.o. female  being seen for Acute on Chronic Respiratory Failure.  Patient currently is resting comfortably she is on the ventilator and full support.  She is not been able to tolerate any weaning attempts over the course of last several days.  Right now is on full support assist control  Review of Systems: Unremarkable other than noted in HPI  Allergies:  Reviewed on the [facility]  Medications: Reviewed  Vitals: Temperature 98.0 pulse 90 respiratory rate 20 blood pressure 138/85 saturations 100%  Ventilator Settings: Mode of ventilation assist control FiO2 25% tidal volume 399 PEEP 5  Physical Exam: . General:  calm and comfortable NAD . Eyes: normal lids, irises & conjunctiva . ENT: grossly normal tongue not enlarged . Neck: no masses . Cardiovascular: S1 S2 Normal no rubs no gallop . Respiratory: Coarse breath sounds no rhonchi . Abdomen: soft non-distended . Skin: no rash seen on limited exam . Musculoskeletal:  no rigidity . Psychiatric: unable to assess . Neurologic: no involuntary movements          Lab Data and radiological Data:  No labs to report   Assessment/Plan  Patient Active Problem List   Diagnosis Date Noted  . Acute on chronic respiratory failure with hypoxia (HCC)   . Quadriplegia and quadriparesis (HCC)   . Obstructive sleep apnea   . Chronic diastolic heart failure (HCC)   . Morbid obesity (HCC)       1. Acute on chronic respiratory failure with hypoxia we will continue with full vent support and periodically continue to check the RSB I.  Unfortunately she is not likely to wean completely off the ventilator. 2. Quadriplegia progressive we will continue with supportive care. 3. Sleep apnea nonissue patient is on the vent 4. Chronic diastolic  heart failure continue with current management 5. Morbid obesity dietary management we will continue to monitor   I have personally evaluated the patient, evaluated the laboratory and imaging results and formulated the assessment and plan and placed orders as needed. The Patient requires high complexity decision making for assessment and support. I have discussed the patient on rounds with the Respiratory Staff   Yevonne Pax, MD [facility] Pulmonary Critical Care Medicine

## 2018-08-17 ENCOUNTER — Other Ambulatory Visit (HOSPITAL_COMMUNITY): Payer: Medicare Other | Admitting: Internal Medicine

## 2018-08-17 DIAGNOSIS — J9621 Acute and chronic respiratory failure with hypoxia: Secondary | ICD-10-CM

## 2018-08-17 DIAGNOSIS — G4733 Obstructive sleep apnea (adult) (pediatric): Secondary | ICD-10-CM | POA: Diagnosis not present

## 2018-08-17 DIAGNOSIS — I5032 Chronic diastolic (congestive) heart failure: Secondary | ICD-10-CM

## 2018-08-17 DIAGNOSIS — G8250 Quadriplegia, unspecified: Secondary | ICD-10-CM

## 2018-08-17 NOTE — Progress Notes (Signed)
[facility]  PROGRESS NOTE  PULMONARY SERVICE ROUNDS  Date of Service: 08/17/2018  Pamela George  DOB: 01-Oct-1960  Referring physician: Larena Glassman, MD  HPI: Pamela George is a 58 y.o. female  being seen for Acute on Chronic Respiratory Failure.  She remains on the ventilator and full support currently on assist control mode requiring 25% FiO2  Review of Systems: Unremarkable other than noted in HPI  Allergies:  Reviewed on the [facility]  Medications: Reviewed  Vitals: Temperature 97.1 pulse 79 respiratory rate 16 blood pressure 110/80 saturations 98%  Ventilator Settings: Mode ventilation assist control FiO2 25% tidal volume 393 PEEP 5  Physical Exam: . General:  calm and comfortable NAD . Eyes: normal lids, irises & conjunctiva . ENT: grossly normal tongue not enlarged . Neck: no masses . Cardiovascular: S1 S2 Normal no rubs no gallop . Respiratory: Scattered rhonchi noted . Abdomen: soft non-distended . Skin: no rash seen on limited exam . Musculoskeletal:  no rigidity . Psychiatric: unable to assess . Neurologic: no involuntary movements          Lab Data and radiological Data:  Sodium 136 potassium 3.9 BUN 12 creatinine 0.4 White count 5.3 hemoglobin 10.4 hematocrit 34.1 platelet count 232   Assessment/Plan  Patient Active Problem List   Diagnosis Date Noted  . Acute on chronic respiratory failure with hypoxia (HCC)   . Quadriplegia and quadriparesis (HCC)   . Obstructive sleep apnea   . Chronic diastolic heart failure (HCC)   . Morbid obesity (HCC)       1. Acute on chronic respiratory failure with hypoxia we will continue with the full vent support patient is now amenable. 2. Quadriplegia at baseline continue supportive care 3. OSA nonissue at this time 4. Chronic diastolic heart failure at baseline 5. Morbid obesity continue with supportive care   I have personally evaluated the patient, evaluated the laboratory and imaging  results and formulated the assessment and plan and placed orders as needed. The Patient requires high complexity decision making for assessment and support. I have discussed the patient on rounds with the Respiratory Staff   Yevonne Pax, MD [facility] Pulmonary Critical Care Medicine

## 2018-08-18 ENCOUNTER — Other Ambulatory Visit (HOSPITAL_COMMUNITY): Payer: Medicare Other | Admitting: Internal Medicine

## 2018-08-18 DIAGNOSIS — J9621 Acute and chronic respiratory failure with hypoxia: Secondary | ICD-10-CM

## 2018-08-18 DIAGNOSIS — I5032 Chronic diastolic (congestive) heart failure: Secondary | ICD-10-CM

## 2018-08-18 DIAGNOSIS — G8250 Quadriplegia, unspecified: Secondary | ICD-10-CM

## 2018-08-18 DIAGNOSIS — G4733 Obstructive sleep apnea (adult) (pediatric): Secondary | ICD-10-CM

## 2018-08-18 NOTE — Progress Notes (Signed)
[facility]  PROGRESS NOTE  PULMONARY SERVICE ROUNDS  Date of Service: 08/18/2018  Pamela George  DOB: October 06, 1960  Referring physician: Larena Glassman, MD  HPI: Pamela George is a 57 y.o. female  being seen for Acute on Chronic Respiratory Failure.  She is awake comfortable without distress remains in the cervical collar.  Review of Systems: Unremarkable other than noted in HPI  Allergies:  Reviewed on the [facility]  Medications: Reviewed  Vitals: Temperature 98 pulse 57 respiratory rate 18 blood pressure 158/83 saturations 100%  Ventilator Settings: Mode ventilation assist control FiO2 25% tidal volume 363 PEEP 5  Physical Exam: . General:  calm and comfortable NAD . Eyes: normal lids, irises & conjunctiva . ENT: grossly normal tongue not enlarged . Neck: no masses . Cardiovascular: S1 S2 Normal no rubs no gallop . Respiratory: No rhonchi or rales are noted at this time . Abdomen: soft non-distended . Skin: no rash seen on limited exam . Musculoskeletal:  no rigidity . Psychiatric: unable to assess . Neurologic: no involuntary movements          Lab Data and radiological Data:  No labs to report today   Assessment/Plan  Patient Active Problem List   Diagnosis Date Noted  . Acute on chronic respiratory failure with hypoxia (HCC)   . Quadriplegia and quadriparesis (HCC)   . Obstructive sleep apnea   . Chronic diastolic heart failure (HCC)   . Morbid obesity (HCC)       1. Acute on chronic respiratory failure with hypoxia we will continue with full vent support.  She is not going to be able to wean off the ventilator. 2. Quadriplegia at baseline 3. Struct of sleep apnea nonissue patient is on the ventilator 4. Chronic diastolic heart failure compensated we will continue with supportive care 5. Morbid obesity at baseline   I have personally evaluated the patient, evaluated the laboratory and imaging results and formulated the assessment and  plan and placed orders as needed. The Patient requires high complexity decision making for assessment and support. I have discussed the patient on rounds with the Respiratory Staff   Yevonne Pax, MD [facility] Pulmonary Critical Care Medicine

## 2018-12-18 ENCOUNTER — Emergency Department (HOSPITAL_COMMUNITY)
Admission: EM | Admit: 2018-12-18 | Discharge: 2018-12-18 | Disposition: A | Discharge status: Home / Self Care | Payer: Medicare Other | Attending: Emergency Medicine | Admitting: Emergency Medicine

## 2018-12-18 ENCOUNTER — Emergency Department (HOSPITAL_COMMUNITY): Payer: Medicare Other

## 2018-12-18 ENCOUNTER — Other Ambulatory Visit: Payer: Self-pay

## 2018-12-18 DIAGNOSIS — I5032 Chronic diastolic (congestive) heart failure: Secondary | ICD-10-CM | POA: Diagnosis not present

## 2018-12-18 DIAGNOSIS — N3000 Acute cystitis without hematuria: Secondary | ICD-10-CM | POA: Diagnosis not present

## 2018-12-18 DIAGNOSIS — J9500 Unspecified tracheostomy complication: Secondary | ICD-10-CM | POA: Diagnosis present

## 2018-12-18 LAB — POCT I-STAT 7, (LYTES, BLD GAS, ICA,H+H)
Acid-Base Excess: 8 mmol/L — ABNORMAL HIGH (ref 0.0–2.0)
Bicarbonate: 34.8 mmol/L — ABNORMAL HIGH (ref 20.0–28.0)
Calcium, Ion: 1.29 mmol/L (ref 1.15–1.40)
HCT: 37 % (ref 36.0–46.0)
Hemoglobin: 12.6 g/dL (ref 12.0–15.0)
O2 Saturation: 97 %
Patient temperature: 98.6
Potassium: 3.6 mmol/L (ref 3.5–5.1)
Sodium: 138 mmol/L (ref 135–145)
TCO2: 37 mmol/L — ABNORMAL HIGH (ref 22–32)
pCO2 arterial: 58.5 mmHg — ABNORMAL HIGH (ref 32.0–48.0)
pH, Arterial: 7.383 (ref 7.350–7.450)
pO2, Arterial: 92 mmHg (ref 83.0–108.0)

## 2018-12-18 LAB — COMPREHENSIVE METABOLIC PANEL
ALT: 42 U/L (ref 0–44)
AST: 28 U/L (ref 15–41)
Albumin: 2.6 g/dL — ABNORMAL LOW (ref 3.5–5.0)
Alkaline Phosphatase: 112 U/L (ref 38–126)
Anion gap: 13 (ref 5–15)
BUN: 18 mg/dL (ref 6–20)
CO2: 31 mmol/L (ref 22–32)
Calcium: 9.5 mg/dL (ref 8.9–10.3)
Chloride: 96 mmol/L — ABNORMAL LOW (ref 98–111)
Creatinine, Ser: 0.42 mg/dL — ABNORMAL LOW (ref 0.44–1.00)
GFR calc Af Amer: >60 mL/min (ref >60)
GFR calc non Af Amer: >60 mL/min (ref >60)
Glucose, Bld: 176 mg/dL — ABNORMAL HIGH (ref 70–99)
Potassium: 3.8 mmol/L (ref 3.5–5.1)
Sodium: 140 mmol/L (ref 135–145)
Total Bilirubin: 0.5 mg/dL (ref 0.3–1.2)
Total Protein: 6.9 g/dL (ref 6.5–8.1)

## 2018-12-18 LAB — URINALYSIS, ROUTINE W REFLEX MICROSCOPIC
Bilirubin Urine: NEGATIVE
Glucose, UA: NEGATIVE mg/dL
Ketones, ur: NEGATIVE mg/dL
Nitrite: NEGATIVE
Protein, ur: 30 mg/dL — AB
Specific Gravity, Urine: 1.014 (ref 1.005–1.030)
WBC, UA: >50 WBC/hpf — ABNORMAL HIGH (ref 0–5)
pH: 6 (ref 5.0–8.0)

## 2018-12-18 LAB — CBC WITH DIFFERENTIAL/PLATELET
Abs Immature Granulocytes: 0.09 10*3/uL — ABNORMAL HIGH (ref 0.00–0.07)
Basophils Absolute: 0 10*3/uL (ref 0.0–0.1)
Basophils Relative: 0 %
Eosinophils Absolute: 0 10*3/uL (ref 0.0–0.5)
Eosinophils Relative: 0 %
HCT: 40.3 % (ref 36.0–46.0)
Hemoglobin: 12 g/dL (ref 12.0–15.0)
Immature Granulocytes: 1 %
Lymphocytes Relative: 4 %
Lymphs Abs: 0.5 10*3/uL — ABNORMAL LOW (ref 0.7–4.0)
MCH: 25.2 pg — ABNORMAL LOW (ref 26.0–34.0)
MCHC: 29.8 g/dL — ABNORMAL LOW (ref 30.0–36.0)
MCV: 84.7 fL (ref 80.0–100.0)
Monocytes Absolute: 0.5 10*3/uL (ref 0.1–1.0)
Monocytes Relative: 4 %
Neutro Abs: 12.4 10*3/uL — ABNORMAL HIGH (ref 1.7–7.7)
Neutrophils Relative %: 91 %
Platelets: 335 10*3/uL (ref 150–400)
RBC: 4.76 MIL/uL (ref 3.87–5.11)
RDW: 19.6 % — ABNORMAL HIGH (ref 11.5–15.5)
WBC: 13.5 10*3/uL — ABNORMAL HIGH (ref 4.0–10.5)
nRBC: 0 % (ref 0.0–0.2)

## 2018-12-18 LAB — CBG MONITORING, ED: Glucose-Capillary: 173 mg/dL — ABNORMAL HIGH (ref 70–99)

## 2018-12-18 LAB — TROPONIN I: Troponin I: <0.03 ng/mL (ref <0.03)

## 2018-12-18 MED ORDER — SODIUM CHLORIDE 0.9 % IV SOLN
1.0000 g | Freq: Once | INTRAVENOUS | Status: AC
  Administered 2018-12-18: 05:00:00 1 g via INTRAVENOUS
  Filled 2018-12-18: qty 10

## 2018-12-18 NOTE — ED Notes (Signed)
Called [facility] for transport to [phone]

## 2018-12-18 NOTE — ED Provider Notes (Signed)
TIME SEEN: 3:21 AM  CHIEF COMPLAINT: Episodes of unresponsiveness and hypoxia  HPI: Patient is a 58 year old female with history of quadriplegia secondary to severe cervical stenosis, acute respiratory failure with tracheostomy who is vent dependent who comes to us from [facility] for episodes of unresponsive noticed tonight and intermittent hypoxia.  Patient unable to provide much history.  She denies that she is having any new pain.  She is repeatedly asking to talk to her family.  ROS: Level 5 caveat given patient is vent dependent  PAST MEDICAL HISTORY/PAST SURGICAL HISTORY:  Past Medical History:  Diagnosis Date  . Acute on chronic respiratory failure with hypoxia (HCC)   . Chronic diastolic heart failure (HCC)   . Morbid obesity (HCC)   . Obstructive sleep apnea   . Quadriplegia and quadriparesis (HCC)     MEDICATIONS:  Prior to Admission medications   Not on File    ALLERGIES:  Not on File  SOCIAL HISTORY:  Social History   Tobacco Use  . Smoking status: Not on file  Substance Use Topics  . Alcohol use: Not on file    FAMILY HISTORY: No family history on file.  EXAM: BP 104/87 (BP Location: Left Arm)   Pulse (!) 111   Temp 97.8 F (36.6 C) (Temporal)   Ht 5\' 2"  (1.575 m)   Wt 70.3 kg   SpO2 100%   BMI 28.35 kg/m  CONSTITUTIONAL: Alert and can answer some questions intermittently.  Chronically ill-appearing.  Obese.  Does not appear to be in distress. HEAD: Normocephalic EYES: Conjunctivae clear, pupils appear equal, EOMI ENT: normal nose; moist mucous membranes NECK: Supple, no meningismus, no nuchal rigidity, no LAD, trach in place without surrounding redness or warmth, no drainage CARD: Regular and tachycardic; S1 and S2 appreciated; no murmurs, no clicks, no rubs, no gallops RESP: Normal chest excursion without splinting or tachypnea; breath sounds clear and equal bilaterally; no wheezes, no rhonchi, no rales, no hypoxia or respiratory distress,  speaking full sentences ABD/GI: Normal bowel sounds; non-distended; soft, non-tender, no rebound, no guarding, no peritoneal signs, no hepatosplenomegaly EXT: Normal ROM in all joints; no edema, no acute abnormality noted SKIN: Normal color for age and race; warm; no rash NEURO: Patient is a quadriplegic, no facial asymmetry   MEDICAL DECISION MAKING: Patient here with episodes of unresponsive naris and hypoxia according to EMS.  They report that patient had her trach exchange just prior to transport.  Patient here denies any pain.  She is tachycardic but otherwise hemodynamically stable.  It appears she was previously on assist control with a rate of 12 with an FiO2 of 28% per her records.  We will place her back on the vent and obtain an ABG.  Will obtain labs, urine, chest x-ray.  She appears to be mentating normally.  I do not feel at this time she needs a head CT.  ED PROGRESS:    3:20 AM  Spoke to patient[facility] nurse [facility]bin [facility]arla at [facility].  Tonight she was unresponsive and hypoxia.  Intermittently hypoxic throughout the day.  PCO2 on blood gas was 151.  Sats in 4380s.  Janina Mayorach was exchanged prior to transport with EMS with improvement of symptoms.  Unresponsive for 15 minutes - hard to arouse.  No seizure activity.  No fever recently.    4:00 AM  Pt's ABG here is very reassuring.  Her episodes of unresponsiveness tonight could have been secondary to hypercapnia at [facility] but this has resolved.   5:00  AM  Pt's labs show leukocytosis with left shift.  Will check a rectal temperature.  A chest x-ray shows low volume with bibasilar atelectasis.  She denies having any cough.  Her lungs are clear.  Urine appears infected but she has a chronic indwelling Foley catheter.  Urine culture has been sent and we will give dose of IV ceftriaxone here in the ED.  Patient continues to do well on vent without unresponsiveness, hypoxia.   5:45 AM  Pt resting comfortably without complaints.  No hypoxia, decreased  responsiveness.  I have talked to [place] at [facility] as patient is unable to take oral medications.  She states that they can call their doctor on-call when patient comes back to their facility to determine if they would like to continue IV antibiotics.  Patient does have a PICC line in place.  I feel this is a reasonable plan and will discharge with copies of patient's results here.  I do not feel she needs admission to our hospital.  Patient is also comfortable with this plan.  At this time, I do not feel there is any life-threatening condition present. I have reviewed and discussed all results (EKG, imaging, lab, urine as appropriate) and exam findings with patient/family. I have reviewed nursing notes and appropriate previous records.  I feel the patient is safe to be discharged home without further emergent workup and can continue workup as an outpatient as needed. Discussed usual and customary return precautions. Patient/family verbalize understanding and are comfortable with this plan.  Outpatient follow-up has been provided as needed. All questions have been answered.   EKG Interpretation  Date/Time:  Friday Dec 18 2018 03:11:22 EDT Ventricular Rate:  111 PR Interval:    QRS Duration: 81 QT Interval:  348 QTC Calculation: 473 R Axis:   20 Text Interpretation:  Sinus tachycardia Inferior infarct, age indeterminate No old tracing to compare Confirmed by Shamiracle Gorden, Baxter Hire [phone]) on 12/18/2018 3:24:44 AM         CRITICAL CARE Performed by: Baxter Hire Kanae Ignatowski   Total critical care time: 55 minutes  Critical care time was exclusive of separately billable procedures and treating other patients.  Critical care was necessary to treat or prevent imminent or life-threatening deterioration.  Critical care was time spent personally by me on the following activities: development of treatment plan with patient and/or surrogate as well as nursing, discussions with consultants, evaluation of patient's  response to treatment, examination of patient, obtaining history from patient or surrogate, ordering and performing treatments and interventions, ordering and review of laboratory studies, ordering and review of radiographic studies, pulse oximetry and re-evaluation of patient's condition.    Bassy Fetterly, Layla Maw, DO 12/18/18 [phone]

## 2018-12-18 NOTE — Discharge Instructions (Addendum)
You were seen in the emergency department for an episode of unresponsiveness and intermittent hypoxia.  These episodes improved after your trach was exchanged.  You have not had any abnormal events in the emergency department.  Your blood gas here is reassuring.  Your chest x-ray shows no pneumonia.  Your urine appears to be infected but this could be from colonization from bacteria from a chronic indwelling Foley catheter.  We have given you a dose of IV ceftriaxone here and sent a urine culture.  I have recommended that the doctor on-call at [facility] see you today to determine if they would like to continue IV antibiotics through your PICC line.

## 2018-12-18 NOTE — ED Notes (Signed)
[facility] at bedside to pick up patient.

## 2018-12-18 NOTE — ED Triage Notes (Addendum)
Came in via ems; reported trach tube replaced by sending facility and was sent for further eval d/t high CO2 prior to trach replacement

## 2018-12-19 LAB — URINE CULTURE: Culture: >=100000 — AB

## 2018-12-20 ENCOUNTER — Telehealth: Payer: Self-pay | Admitting: Emergency Medicine

## 2018-12-20 NOTE — Telephone Encounter (Signed)
Post ED Visit - Positive Culture Follow-up: Successful Patient Follow-Up  Culture assessed and recommendations reviewed by:  []  Enzo Bi, Pharm.D. []  Celedonio Miyamoto, Pharm.D., BCPS AQ-ID []  Garvin Fila, Pharm.D., BCPS [x]  Georgina Pillion, Pharm.D., BCPS []  [place], [place].D., BCPS, AAHIVP []  Estella Husk, Pharm.D., BCPS, AAHIVP []  Lysle Pearl, PharmD, BCPS []  Phillips Climes, PharmD, BCPS []  Agapito Games, PharmD, BCPS []  Verlan Friends, PharmD  Positive urine culture  [x]  Patient discharged without antimicrobial prescription and treatment is now indicated []  Organism is resistant to prescribed ED discharge antimicrobial []  Patient with positive blood cultures  **Patient resident @ [facility] (phone # [phone]). Called and spoke with patient's nurse [place] RN. Result faxed to [facility] unit [facility] [phone].  Contacted patient's nurse, date 12/20/2018, time 1545   Norm Parcel 12/20/2018, 3:46 PM

## 2019-04-14 ENCOUNTER — Other Ambulatory Visit: Payer: Self-pay

## 2019-04-14 ENCOUNTER — Encounter (HOSPITAL_COMMUNITY): Payer: Self-pay | Admitting: Emergency Medicine

## 2019-04-14 ENCOUNTER — Emergency Department (HOSPITAL_COMMUNITY)
Admission: EM | Admit: 2019-04-14 | Discharge: 2019-04-14 | Disposition: A | Discharge status: Home / Self Care | Payer: Medicare Other | Attending: Emergency Medicine | Admitting: Emergency Medicine

## 2019-04-14 DIAGNOSIS — Z8669 Personal history of other diseases of the nervous system and sense organs: Secondary | ICD-10-CM

## 2019-04-14 DIAGNOSIS — R001 Bradycardia, unspecified: Secondary | ICD-10-CM | POA: Insufficient documentation

## 2019-04-14 DIAGNOSIS — I5032 Chronic diastolic (congestive) heart failure: Secondary | ICD-10-CM | POA: Insufficient documentation

## 2019-04-14 DIAGNOSIS — G8250 Quadriplegia, unspecified: Secondary | ICD-10-CM | POA: Insufficient documentation

## 2019-04-14 LAB — CBC WITH DIFFERENTIAL/PLATELET
Abs Immature Granulocytes: 0.02 10*3/uL (ref 0.00–0.07)
Basophils Absolute: 0 10*3/uL (ref 0.0–0.1)
Basophils Relative: 0 %
Eosinophils Absolute: 0.1 10*3/uL (ref 0.0–0.5)
Eosinophils Relative: 1 %
HCT: 39.2 % (ref 36.0–46.0)
Hemoglobin: 12.3 g/dL (ref 12.0–15.0)
Immature Granulocytes: 0 %
Lymphocytes Relative: 14 %
Lymphs Abs: 0.8 10*3/uL (ref 0.7–4.0)
MCH: 28 pg (ref 26.0–34.0)
MCHC: 31.4 g/dL (ref 30.0–36.0)
MCV: 89.1 fL (ref 80.0–100.0)
Monocytes Absolute: 0.4 10*3/uL (ref 0.1–1.0)
Monocytes Relative: 7 %
Neutro Abs: 4.4 10*3/uL (ref 1.7–7.7)
Neutrophils Relative %: 78 %
Platelets: 273 10*3/uL (ref 150–400)
RBC: 4.4 MIL/uL (ref 3.87–5.11)
RDW: 15.6 % — ABNORMAL HIGH (ref 11.5–15.5)
WBC: 5.8 10*3/uL (ref 4.0–10.5)
nRBC: 0 % (ref 0.0–0.2)

## 2019-04-14 LAB — BASIC METABOLIC PANEL
Anion gap: 9 (ref 5–15)
BUN: 19 mg/dL (ref 6–20)
CO2: 31 mmol/L (ref 22–32)
Calcium: 9.3 mg/dL (ref 8.9–10.3)
Chloride: 97 mmol/L — ABNORMAL LOW (ref 98–111)
Creatinine, Ser: 0.34 mg/dL — ABNORMAL LOW (ref 0.44–1.00)
GFR calc Af Amer: >60 mL/min (ref >60)
GFR calc non Af Amer: >60 mL/min (ref >60)
Glucose, Bld: 112 mg/dL — ABNORMAL HIGH (ref 70–99)
Potassium: 3.8 mmol/L (ref 3.5–5.1)
Sodium: 137 mmol/L (ref 135–145)

## 2019-04-14 NOTE — ED Notes (Signed)
CALLED [facility] SPOKE WITH [place]

## 2019-04-14 NOTE — ED Triage Notes (Signed)
Patient transported by [facility] from [facility] due to staff reporting bradycardia this morning. Patient trach vent dependent, wears C-collar due to spinal stenosis.

## 2019-04-14 NOTE — ED Provider Notes (Signed)
[place] EMERGENCY DEPARTMENT Provider Note   CSN: [id number] Arrival date & time: 04/14/19  1154     History   Chief Complaint No chief complaint on file.   HPI Jacklynn Dehaas is a 58 y.o. female.     Patient is a 59 year old female with past medical history of quadriplegia with respiratory failure.  Patient is vent dependent and is a resident at [facility].  She was sent here today for evaluation of low heart rate.  I am told that her heart rate was in the upper 30s this morning and was sent here for evaluation.  Her heart rate is now in the 50s.  Patient has no complaints now and had no symptoms when her heart rate was determined to be in the 30s.  She denies fevers or chills.  She denies any cough.  The history is provided by the patient.    Past Medical History:  Diagnosis Date  . Acute on chronic respiratory failure with hypoxia ([place])   . Chronic diastolic heart failure ([place])   . Morbid obesity ([place])   . Obstructive sleep apnea   . Quadriplegia and quadriparesis [facility])     Patient Active Problem List   Diagnosis Date Noted  . Acute on chronic respiratory failure with hypoxia ([place])   . Quadriplegia and quadriparesis ([place])   . Obstructive sleep apnea   . Chronic diastolic heart failure ([place])   . Morbid obesity ([place])     Past Surgical History:  Procedure Laterality Date  . PEG TUBE PLACEMENT    . TRACHEOSTOMY       OB History   No obstetric history on file.      Home Medications    Prior to Admission medications   Not on File    Family History No family history on file.  Social History Social History   Tobacco Use  . Smoking status: Not on file  Substance Use Topics  . Alcohol use: Not on file  . Drug use: Not on file     Allergies   Penicillins and Zosyn [piperacillin sod-tazobactam so]   Review of Systems Review of Systems  All other systems reviewed and are negative.    Physical Exam Updated Vital  Signs BP 102/60   Pulse (!) 55   Temp 98.6 F (37 C)   Resp 14   SpO2 100%   Physical Exam Vitals signs and nursing note reviewed.  Constitutional:      General: She is not in acute distress.    Appearance: She is well-developed. She is not diaphoretic.     Comments: Patient is awake and alert.  She is in no distress.  HENT:     Head: Normocephalic and atraumatic.  Neck:     Musculoskeletal: Normal range of motion and neck supple.  Cardiovascular:     Rate and Rhythm: Normal rate and regular rhythm.     Heart sounds: No murmur. No friction rub. No gallop.   Pulmonary:     Effort: Pulmonary effort is normal. No respiratory distress.     Breath sounds: Normal breath sounds. No wheezing.  Abdominal:     General: Bowel sounds are normal. There is no distension.     Palpations: Abdomen is soft.     Tenderness: There is no abdominal tenderness.  Skin:    General: Skin is warm and dry.  Neurological:     Mental Status: She is alert and oriented to person, place, and time.  ED Treatments / Results  Labs (all labs ordered are listed, but only abnormal results are displayed) Labs Reviewed  BASIC METABOLIC PANEL  CBC WITH DIFFERENTIAL/PLATELET    EKG EKG Interpretation  Date/Time:  Wednesday April 14 2019 12:02:07 EDT Ventricular Rate:  56 PR Interval:    QRS Duration: 93 QT Interval:  456 QTC Calculation: 441 R Axis:   42 Text Interpretation:  Sinus rhythm Borderline T abnormalities, diffuse leads Confirmed by Geoffery LyonseLo, Tianni Escamilla ([id number]) on 04/14/2019 12:09:10 PM   Radiology No results found.  Procedures Procedures (including critical care time)  Medications Ordered in ED Medications - No data to display   Initial Impression / Assessment and Plan / ED Course  I have reviewed the triage vital signs and the nursing notes.  Pertinent labs & imaging results that were available during my care of the patient were reviewed by me and considered in my medical  decision making (see chart for details).  Patient is 58 year old female with history of quadriplegia [facility] for evaluation of low heart rate.  I am told her heart rate was in the upper 30s this morning, however has been consistently in the upper 50s/low 60s while here in the ER.  Her blood counts and electrolytes are all unremarkable.  Patient has been observed for several hours in the ER and has had no episodes of bradycardia or arrhythmia.  I see no indication for admission or further intervention.  From my standpoint, I feel as though the patient is cleared for return to [facility].  She can be sent back here should she experience additional problems.  Final Clinical Impressions(s) / ED Diagnoses   Final diagnoses:  None    ED Discharge Orders    None       Geoffery Lyonselo, Kaysee Hergert, MD 04/14/19 1429

## 2019-04-14 NOTE — Discharge Instructions (Signed)
Return to the emergency department for any new and/or concerning symptoms. 

## 2019-05-10 ENCOUNTER — Encounter (HOSPITAL_COMMUNITY): Payer: Self-pay

## 2019-05-10 ENCOUNTER — Emergency Department (HOSPITAL_COMMUNITY): Payer: Medicare Other

## 2019-05-10 ENCOUNTER — Emergency Department (HOSPITAL_COMMUNITY)
Admission: EM | Admit: 2019-05-10 | Discharge: 2019-05-10 | Disposition: A | Discharge status: Home / Self Care | Payer: Medicare Other | Source: Home / Self Care | Attending: Emergency Medicine | Admitting: Emergency Medicine

## 2019-05-10 DIAGNOSIS — R404 Transient alteration of awareness: Secondary | ICD-10-CM

## 2019-05-10 DIAGNOSIS — E785 Hyperlipidemia, unspecified: Secondary | ICD-10-CM | POA: Diagnosis not present

## 2019-05-10 DIAGNOSIS — G4733 Obstructive sleep apnea (adult) (pediatric): Secondary | ICD-10-CM | POA: Diagnosis not present

## 2019-05-10 DIAGNOSIS — R350 Frequency of micturition: Secondary | ICD-10-CM | POA: Diagnosis not present

## 2019-05-10 DIAGNOSIS — Z20828 Contact with and (suspected) exposure to other viral communicable diseases: Secondary | ICD-10-CM | POA: Insufficient documentation

## 2019-05-10 DIAGNOSIS — Z930 Tracheostomy status: Secondary | ICD-10-CM | POA: Insufficient documentation

## 2019-05-10 DIAGNOSIS — M4802 Spinal stenosis, cervical region: Secondary | ICD-10-CM | POA: Diagnosis not present

## 2019-05-10 DIAGNOSIS — I5032 Chronic diastolic (congestive) heart failure: Secondary | ICD-10-CM | POA: Diagnosis not present

## 2019-05-10 DIAGNOSIS — J9611 Chronic respiratory failure with hypoxia: Secondary | ICD-10-CM | POA: Insufficient documentation

## 2019-05-10 DIAGNOSIS — E119 Type 2 diabetes mellitus without complications: Secondary | ICD-10-CM | POA: Diagnosis not present

## 2019-05-10 DIAGNOSIS — R55 Syncope and collapse: Secondary | ICD-10-CM | POA: Insufficient documentation

## 2019-05-10 DIAGNOSIS — I420 Dilated cardiomyopathy: Secondary | ICD-10-CM | POA: Diagnosis not present

## 2019-05-10 DIAGNOSIS — M129 Arthropathy, unspecified: Secondary | ICD-10-CM | POA: Diagnosis not present

## 2019-05-10 DIAGNOSIS — J9811 Atelectasis: Secondary | ICD-10-CM | POA: Diagnosis not present

## 2019-05-10 DIAGNOSIS — E870 Hyperosmolality and hypernatremia: Secondary | ICD-10-CM | POA: Diagnosis not present

## 2019-05-10 DIAGNOSIS — K5900 Constipation, unspecified: Secondary | ICD-10-CM | POA: Diagnosis not present

## 2019-05-10 DIAGNOSIS — G8250 Quadriplegia, unspecified: Secondary | ICD-10-CM | POA: Diagnosis not present

## 2019-05-10 DIAGNOSIS — E876 Hypokalemia: Secondary | ICD-10-CM | POA: Diagnosis not present

## 2019-05-10 DIAGNOSIS — I110 Hypertensive heart disease with heart failure: Secondary | ICD-10-CM | POA: Diagnosis not present

## 2019-05-10 DIAGNOSIS — R4189 Other symptoms and signs involving cognitive functions and awareness: Secondary | ICD-10-CM

## 2019-05-10 DIAGNOSIS — I493 Ventricular premature depolarization: Secondary | ICD-10-CM | POA: Diagnosis not present

## 2019-05-10 DIAGNOSIS — L89154 Pressure ulcer of sacral region, stage 4: Secondary | ICD-10-CM | POA: Diagnosis not present

## 2019-05-10 DIAGNOSIS — Z9911 Dependence on respirator [ventilator] status: Secondary | ICD-10-CM | POA: Diagnosis not present

## 2019-05-10 DIAGNOSIS — R532 Functional quadriplegia: Secondary | ICD-10-CM | POA: Insufficient documentation

## 2019-05-10 DIAGNOSIS — F419 Anxiety disorder, unspecified: Secondary | ICD-10-CM | POA: Diagnosis not present

## 2019-05-10 DIAGNOSIS — R001 Bradycardia, unspecified: Secondary | ICD-10-CM | POA: Diagnosis not present

## 2019-05-10 LAB — BASIC METABOLIC PANEL
Anion gap: 10 (ref 5–15)
BUN: 25 mg/dL — ABNORMAL HIGH (ref 6–20)
CO2: 30 mmol/L (ref 22–32)
Calcium: 9.3 mg/dL (ref 8.9–10.3)
Chloride: 107 mmol/L (ref 98–111)
Creatinine, Ser: 0.39 mg/dL — ABNORMAL LOW (ref 0.44–1.00)
GFR calc Af Amer: >60 mL/min (ref >60)
GFR calc non Af Amer: >60 mL/min (ref >60)
Glucose, Bld: 181 mg/dL — ABNORMAL HIGH (ref 70–99)
Potassium: 3.2 mmol/L — ABNORMAL LOW (ref 3.5–5.1)
Sodium: 147 mmol/L — ABNORMAL HIGH (ref 135–145)

## 2019-05-10 LAB — CBC WITH DIFFERENTIAL/PLATELET
Abs Immature Granulocytes: 0.03 10*3/uL (ref 0.00–0.07)
Basophils Absolute: 0 10*3/uL (ref 0.0–0.1)
Basophils Relative: 0 %
Eosinophils Absolute: 0 10*3/uL (ref 0.0–0.5)
Eosinophils Relative: 0 %
HCT: 39.9 % (ref 36.0–46.0)
Hemoglobin: 11.6 g/dL — ABNORMAL LOW (ref 12.0–15.0)
Immature Granulocytes: 0 %
Lymphocytes Relative: 9 %
Lymphs Abs: 0.8 10*3/uL (ref 0.7–4.0)
MCH: 27.3 pg (ref 26.0–34.0)
MCHC: 29.1 g/dL — ABNORMAL LOW (ref 30.0–36.0)
MCV: 93.9 fL (ref 80.0–100.0)
Monocytes Absolute: 0.3 10*3/uL (ref 0.1–1.0)
Monocytes Relative: 4 %
Neutro Abs: 7.4 10*3/uL (ref 1.7–7.7)
Neutrophils Relative %: 87 %
Platelets: 294 10*3/uL (ref 150–400)
RBC: 4.25 MIL/uL (ref 3.87–5.11)
RDW: 15.6 % — ABNORMAL HIGH (ref 11.5–15.5)
WBC: 8.6 10*3/uL (ref 4.0–10.5)
nRBC: 0 % (ref 0.0–0.2)

## 2019-05-10 LAB — POCT I-STAT 7, (LYTES, BLD GAS, ICA,H+H)
Acid-Base Excess: 8 mmol/L — ABNORMAL HIGH (ref 0.0–2.0)
Bicarbonate: 33.9 mmol/L — ABNORMAL HIGH (ref 20.0–28.0)
Calcium, Ion: 1.28 mmol/L (ref 1.15–1.40)
HCT: 36 % (ref 36.0–46.0)
Hemoglobin: 12.2 g/dL (ref 12.0–15.0)
O2 Saturation: 99 %
Patient temperature: 98
Potassium: 3.3 mmol/L — ABNORMAL LOW (ref 3.5–5.1)
Sodium: 149 mmol/L — ABNORMAL HIGH (ref 135–145)
TCO2: 35 mmol/L — ABNORMAL HIGH (ref 22–32)
pCO2 arterial: 49.2 mmHg — ABNORMAL HIGH (ref 32.0–48.0)
pH, Arterial: 7.445 (ref 7.350–7.450)
pO2, Arterial: 123 mmHg — ABNORMAL HIGH (ref 83.0–108.0)

## 2019-05-10 LAB — SARS CORONAVIRUS 2 (TAT 6-24 HRS): SARS Coronavirus 2: NEGATIVE

## 2019-05-10 NOTE — Discharge Instructions (Addendum)
I suspect that patient may have had some mucus plugging.  She is now alert and communicative.  ABG looks okay.  I think she is appropriate for transfer back to facility.

## 2019-05-10 NOTE — ED Provider Notes (Signed)
[facility] EMERGENCY [facility]MENT Provider Note   CSN: [id number] Arrival date & time: 05/10/19  1532     History   Chief Complaint Chief Complaint  Patient presents with  . Loss of Consciousness    HPI Suzi RootsDeborah J Caldron is a 58 y.o. female.     HPI   58 year old female with history of quadriplegia secondary to severe cervical stenosis, chronic respiratory failure with tracheostomy who is vent dependent transported from [facility] for episodes of unresponsive noticed today and intermittent hypoxia.  Patient unable to provide much history.  She mouths words but I am only able to understand a few things. C/o pain from her blood pressure cuff. Denies new pain otherwise. Doesn't seem to remember events leading up to transport to ER today.  ROS: Level 5 caveat given patient is vent dependent  Past Medical History:  Diagnosis Date  . Acute on chronic respiratory failure with hypoxia (HCC)   . Chronic diastolic heart failure (HCC)   . Morbid obesity (HCC)   . Obstructive sleep apnea   . Quadriplegia and quadriparesis [facility](HCC)     Patient Active Problem List   Diagnosis Date Noted  . Acute on chronic respiratory failure with hypoxia (HCC)   . Quadriplegia and quadriparesis (HCC)   . Obstructive sleep apnea   . Chronic diastolic heart failure (HCC)   . Morbid obesity (HCC)     Past Surgical History:  Procedure Laterality Date  . PEG TUBE PLACEMENT    . TRACHEOSTOMY       OB History   No obstetric history on file.      Home Medications    Prior to Admission medications   Not on File    Family History No family history on file.  Social History Social History   Tobacco Use  . Smoking status: Not on file  Substance Use Topics  . Alcohol use: Not on file  . Drug use: Not on file     Allergies   Penicillins and Zosyn [piperacillin sod-tazobactam so]   Review of Systems Review of Systems  Level 5 caveat because pt is  non-verbal/tracheostomy.  Physical Exam Updated Vital Signs BP 110/85   Pulse 86   Temp 98.9 F (37.2 C)   Resp (!) 25   SpO2 100%   Physical Exam Vitals signs and nursing note reviewed.  Constitutional:      General: She is not in acute distress.    Appearance: She is well-developed. She is obese.     Comments: Laying in bed. Cervical collar in place. Has trach on vent. Awake and following commands. NAD.   HENT:     Head: Normocephalic and atraumatic.  Eyes:     General:        Right eye: No discharge.        Left eye: No discharge.     Conjunctiva/sclera: Conjunctivae normal.  Neck:     Musculoskeletal: Neck supple.  Cardiovascular:     Rate and Rhythm: Normal rate and regular rhythm.     Heart sounds: Normal heart sounds. No murmur. No friction rub. No gallop.   Pulmonary:     Effort: Pulmonary effort is normal. No respiratory distress.     Breath sounds: Normal breath sounds.  Abdominal:     General: There is no distension.     Palpations: Abdomen is soft.     Tenderness: There is no abdominal tenderness.  Musculoskeletal:        General:  No tenderness.  Skin:    General: Skin is warm and dry.  Neurological:     Mental Status: She is alert.     Comments: Following commands. Responding to questions by mouthing words. Her comprehension of questions and answers seem appropriate the best I can tell. Globally very weak but able to wiggle b/l toes and squeeze hands.       ED Treatments / Results  Labs (all labs ordered are listed, but only abnormal results are displayed) Labs Reviewed  CBC WITH DIFFERENTIAL/PLATELET - Abnormal; Notable for the following components:      Result Value   Hemoglobin 11.6 (*)    MCHC 29.1 (*)    RDW 15.6 (*)    All other components within normal limits  BASIC METABOLIC PANEL - Abnormal; Notable for the following components:   Sodium 147 (*)    Potassium 3.2 (*)    Glucose, Bld 181 (*)    BUN 25 (*)    Creatinine, Ser 0.39 (*)     All other components within normal limits  POCT I-STAT 7, (LYTES, BLD GAS, ICA,H+H) - Abnormal; Notable for the following components:   pCO2 arterial 49.2 (*)    pO2, Arterial 123.0 (*)    Bicarbonate 33.9 (*)    TCO2 35 (*)    Acid-Base Excess 8.0 (*)    Sodium 149 (*)    Potassium 3.3 (*)    All other components within normal limits  SARS CORONAVIRUS 2 (TAT 6-24 HRS)  I-STAT ARTERIAL BLOOD GAS, ED    EKG EKG Interpretation  Date/Time:  Monday May 10 2019 15:36:41 EDT Ventricular Rate:  95 PR Interval:    QRS Duration: 72 QT Interval:  353 QTC Calculation: 444 R Axis:     Text Interpretation:  Sinus rhythm Consider anterior infarct Confirmed by Virgel Manifold [phone]) on 05/10/2019 3:50:54 PM   Radiology Dg Chest Portable 1 View  Result Date: 05/10/2019 CLINICAL DATA:  Intermittent hypoxemia. EXAM: PORTABLE CHEST 1 VIEW COMPARISON:  Dec 18, 2018 FINDINGS: Tracheostomy tube in stable position. Enlarged cardiac silhouette. Low lung volumes. Bilateral lower lobe streaky airspace opacities may represent atelectasis versus peribronchial airspace consolidation. Osseous structures are without acute abnormality. Soft tissues are grossly normal. IMPRESSION: 1. Low lung volumes. 2. Bilateral lower lobe streaky airspace opacities may represent atelectasis versus peribronchial airspace consolidation. 3. Enlarged cardiac silhouette. Electronically Signed   By: Fidela Salisbury M.D.   On: 05/10/2019 16:07    Procedures Procedures (including critical care time)  Medications Ordered in ED Medications - No data to display   Initial Impression / Assessment and Plan / ED Course  I have reviewed the triage vital signs and the nursing notes.  Pertinent labs & imaging results that were available during my care of the patient were reviewed by me and considered in my medical decision making (see chart for details).     58yF with periods of unresponsiveness and hypoxemia, now resolved.   She is alert, answering questions and following commands.  ABG as above.  Question whether may she had some mucus plugging.  Apparently hypoxemia improved after suctioning.  She has 2 fentanyl patches on her chest.  I doubt that this is the etiology of earlier events though since she is now alert without any intervention beyond the suctioning.  She will be discharged back to [facility] where they should be able to continue to manage her on the ventilator.  Final Clinical Impressions(s) / ED Diagnoses   Final  diagnoses:  Episode of unresponsiveness    ED Discharge Orders    None       Raeford Razor, MD 05/10/19 1844

## 2019-05-13 ENCOUNTER — Other Ambulatory Visit: Payer: Self-pay

## 2019-05-13 ENCOUNTER — Encounter (HOSPITAL_COMMUNITY): Payer: Self-pay | Admitting: Emergency Medicine

## 2019-05-13 ENCOUNTER — Emergency Department (HOSPITAL_COMMUNITY): Payer: Medicare Other

## 2019-05-13 ENCOUNTER — Inpatient Hospital Stay (HOSPITAL_COMMUNITY)
Admission: EM | Admit: 2019-05-13 | Discharge: 2019-05-14 | DRG: 640 | Disposition: A | Discharge status: Skilled Nursing Facility | Payer: Medicare Other | Source: Skilled Nursing Facility | Attending: Family Medicine | Admitting: Family Medicine

## 2019-05-13 DIAGNOSIS — R350 Frequency of micturition: Secondary | ICD-10-CM | POA: Diagnosis not present

## 2019-05-13 DIAGNOSIS — I469 Cardiac arrest, cause unspecified: Secondary | ICD-10-CM | POA: Diagnosis not present

## 2019-05-13 DIAGNOSIS — I110 Hypertensive heart disease with heart failure: Secondary | ICD-10-CM | POA: Diagnosis present

## 2019-05-13 DIAGNOSIS — G4733 Obstructive sleep apnea (adult) (pediatric): Secondary | ICD-10-CM | POA: Diagnosis not present

## 2019-05-13 DIAGNOSIS — K5900 Constipation, unspecified: Secondary | ICD-10-CM | POA: Diagnosis not present

## 2019-05-13 DIAGNOSIS — I420 Dilated cardiomyopathy: Secondary | ICD-10-CM | POA: Diagnosis present

## 2019-05-13 DIAGNOSIS — Z9911 Dependence on respirator [ventilator] status: Secondary | ICD-10-CM | POA: Diagnosis not present

## 2019-05-13 DIAGNOSIS — Z8249 Family history of ischemic heart disease and other diseases of the circulatory system: Secondary | ICD-10-CM

## 2019-05-13 DIAGNOSIS — L89154 Pressure ulcer of sacral region, stage 4: Secondary | ICD-10-CM | POA: Diagnosis present

## 2019-05-13 DIAGNOSIS — I493 Ventricular premature depolarization: Secondary | ICD-10-CM | POA: Diagnosis present

## 2019-05-13 DIAGNOSIS — R001 Bradycardia, unspecified: Secondary | ICD-10-CM | POA: Diagnosis not present

## 2019-05-13 DIAGNOSIS — M129 Arthropathy, unspecified: Secondary | ICD-10-CM | POA: Diagnosis not present

## 2019-05-13 DIAGNOSIS — J9611 Chronic respiratory failure with hypoxia: Secondary | ICD-10-CM | POA: Diagnosis not present

## 2019-05-13 DIAGNOSIS — Z8674 Personal history of sudden cardiac arrest: Secondary | ICD-10-CM

## 2019-05-13 DIAGNOSIS — E870 Hyperosmolality and hypernatremia: Secondary | ICD-10-CM | POA: Diagnosis present

## 2019-05-13 DIAGNOSIS — E785 Hyperlipidemia, unspecified: Secondary | ICD-10-CM

## 2019-05-13 DIAGNOSIS — E876 Hypokalemia: Principal | ICD-10-CM | POA: Diagnosis present

## 2019-05-13 DIAGNOSIS — Z880 Allergy status to penicillin: Secondary | ICD-10-CM

## 2019-05-13 DIAGNOSIS — J9811 Atelectasis: Secondary | ICD-10-CM | POA: Diagnosis not present

## 2019-05-13 DIAGNOSIS — Z8679 Personal history of other diseases of the circulatory system: Secondary | ICD-10-CM

## 2019-05-13 DIAGNOSIS — F419 Anxiety disorder, unspecified: Secondary | ICD-10-CM | POA: Diagnosis not present

## 2019-05-13 DIAGNOSIS — I340 Nonrheumatic mitral (valve) insufficiency: Secondary | ICD-10-CM | POA: Diagnosis not present

## 2019-05-13 DIAGNOSIS — E119 Type 2 diabetes mellitus without complications: Secondary | ICD-10-CM | POA: Diagnosis present

## 2019-05-13 DIAGNOSIS — M4802 Spinal stenosis, cervical region: Secondary | ICD-10-CM | POA: Diagnosis present

## 2019-05-13 DIAGNOSIS — Z931 Gastrostomy status: Secondary | ICD-10-CM

## 2019-05-13 DIAGNOSIS — G8250 Quadriplegia, unspecified: Secondary | ICD-10-CM | POA: Diagnosis present

## 2019-05-13 DIAGNOSIS — Z20828 Contact with and (suspected) exposure to other viral communicable diseases: Secondary | ICD-10-CM | POA: Diagnosis present

## 2019-05-13 DIAGNOSIS — R55 Syncope and collapse: Secondary | ICD-10-CM | POA: Diagnosis present

## 2019-05-13 DIAGNOSIS — I5032 Chronic diastolic (congestive) heart failure: Secondary | ICD-10-CM | POA: Diagnosis not present

## 2019-05-13 DIAGNOSIS — Z888 Allergy status to other drugs, medicaments and biological substances status: Secondary | ICD-10-CM

## 2019-05-13 DIAGNOSIS — Z79899 Other long term (current) drug therapy: Secondary | ICD-10-CM

## 2019-05-13 DIAGNOSIS — Z930 Tracheostomy status: Secondary | ICD-10-CM

## 2019-05-13 HISTORY — DX: Personal history of other diseases of the circulatory system: Z86.79

## 2019-05-13 LAB — BASIC METABOLIC PANEL
Anion gap: 11 (ref 5–15)
BUN: 14 mg/dL (ref 6–20)
CO2: 29 mmol/L (ref 22–32)
Calcium: 9.3 mg/dL (ref 8.9–10.3)
Chloride: 106 mmol/L (ref 98–111)
Creatinine, Ser: 0.32 mg/dL — ABNORMAL LOW (ref 0.44–1.00)
GFR calc Af Amer: >60 mL/min (ref >60)
GFR calc non Af Amer: >60 mL/min (ref >60)
Glucose, Bld: 106 mg/dL — ABNORMAL HIGH (ref 70–99)
Potassium: 3.1 mmol/L — ABNORMAL LOW (ref 3.5–5.1)
Sodium: 146 mmol/L — ABNORMAL HIGH (ref 135–145)

## 2019-05-13 LAB — URINALYSIS, ROUTINE W REFLEX MICROSCOPIC
Bilirubin Urine: NEGATIVE
Glucose, UA: NEGATIVE mg/dL
Ketones, ur: 5 mg/dL — AB
Nitrite: POSITIVE — AB
Protein, ur: 100 mg/dL — AB
Specific Gravity, Urine: 1.019 (ref 1.005–1.030)
pH: 8 (ref 5.0–8.0)

## 2019-05-13 LAB — CBC
HCT: 40.9 % (ref 36.0–46.0)
HCT: 37 % (ref 36.0–46.0)
Hemoglobin: 12.2 g/dL (ref 12.0–15.0)
Hemoglobin: 11.5 g/dL — ABNORMAL LOW (ref 12.0–15.0)
MCH: 27.5 pg (ref 26.0–34.0)
MCH: 28.3 pg (ref 26.0–34.0)
MCHC: 29.8 g/dL — ABNORMAL LOW (ref 30.0–36.0)
MCHC: 31.1 g/dL (ref 30.0–36.0)
MCV: 92.3 fL (ref 80.0–100.0)
MCV: 90.9 fL (ref 80.0–100.0)
Platelets: 247 10*3/uL (ref 150–400)
Platelets: 267 10*3/uL (ref 150–400)
RBC: 4.43 MIL/uL (ref 3.87–5.11)
RBC: 4.07 MIL/uL (ref 3.87–5.11)
RDW: 15.7 % — ABNORMAL HIGH (ref 11.5–15.5)
RDW: 15.8 % — ABNORMAL HIGH (ref 11.5–15.5)
WBC: 6.2 10*3/uL (ref 4.0–10.5)
WBC: 6.3 10*3/uL (ref 4.0–10.5)
nRBC: 0 % (ref 0.0–0.2)
nRBC: 0 % (ref 0.0–0.2)

## 2019-05-13 LAB — TSH: TSH: 0.874 u[IU]/mL (ref 0.350–4.500)

## 2019-05-13 LAB — HIV ANTIBODY (ROUTINE TESTING W REFLEX): HIV Screen 4th Generation wRfx: NONREACTIVE

## 2019-05-13 LAB — CBG MONITORING, ED: Glucose-Capillary: 95 mg/dL (ref 70–99)

## 2019-05-13 LAB — MAGNESIUM: Magnesium: 1.5 mg/dL — ABNORMAL LOW (ref 1.7–2.4)

## 2019-05-13 LAB — CREATININE, SERUM
Creatinine, Ser: 0.42 mg/dL — ABNORMAL LOW (ref 0.44–1.00)
GFR calc Af Amer: >60 mL/min (ref >60)
GFR calc non Af Amer: >60 mL/min (ref >60)

## 2019-05-13 LAB — HEMOGLOBIN A1C
Hgb A1c MFr Bld: 5 % (ref 4.8–5.6)
Mean Plasma Glucose: 96.8 mg/dL

## 2019-05-13 LAB — SARS CORONAVIRUS 2 (TAT 6-24 HRS): SARS Coronavirus 2: NEGATIVE

## 2019-05-13 LAB — TROPONIN I (HIGH SENSITIVITY)
Troponin I (High Sensitivity): 4 ng/L (ref <18)
Troponin I (High Sensitivity): 4 ng/L (ref <18)

## 2019-05-13 MED ORDER — ACETAMINOPHEN 325 MG PO TABS: 650.0000 mg | ORAL_TABLET | Freq: Four times a day (QID) | ORAL | Status: DC | PRN

## 2019-05-13 MED ORDER — FENTANYL 75 MCG/HR TD PT72
1.0000 | MEDICATED_PATCH | TRANSDERMAL | Status: DC
  Administered 2019-05-14: 1 via TRANSDERMAL
  Filled 2019-05-13: qty 1

## 2019-05-13 MED ORDER — JUVEN PO PACK
1.0000 | PACK | Freq: Two times a day (BID) | ORAL | Status: DC
  Administered 2019-05-14 (×2): 1 via ORAL
  Filled 2019-05-13 (×3): qty 1

## 2019-05-13 MED ORDER — ONDANSETRON HCL 4 MG PO TABS
4.0000 mg | ORAL_TABLET | Freq: Once | ORAL | Status: AC
  Administered 2019-05-13: 4 mg
  Filled 2019-05-13: qty 1

## 2019-05-13 MED ORDER — LACTASE 3000 UNITS PO TABS
6000.0000 [IU] | ORAL_TABLET | Freq: Every day | ORAL | Status: DC | PRN
  Filled 2019-05-13: qty 2

## 2019-05-13 MED ORDER — GABAPENTIN 250 MG/5ML PO SOLN
300.0000 mg | Freq: Three times a day (TID) | ORAL | Status: DC
  Administered 2019-05-14 (×3): 300 mg
  Filled 2019-05-13 (×6): qty 6

## 2019-05-13 MED ORDER — INSULIN ASPART 100 UNIT/ML ~~LOC~~ SOLN: 0.0000 [IU] | Freq: Three times a day (TID) | SUBCUTANEOUS | Status: DC

## 2019-05-13 MED ORDER — BACLOFEN 10 MG PO TABS
10.0000 mg | ORAL_TABLET | Freq: Two times a day (BID) | ORAL | Status: DC
  Administered 2019-05-14 (×2): 10 mg
  Filled 2019-05-13 (×5): qty 1

## 2019-05-13 MED ORDER — POTASSIUM CHLORIDE CRYS ER 20 MEQ PO TBCR
40.0000 meq | EXTENDED_RELEASE_TABLET | Freq: Once | ORAL | Status: DC
  Filled 2019-05-13: qty 2

## 2019-05-13 MED ORDER — POLYETHYLENE GLYCOL 3350 17 G PO PACK: 17.0000 g | PACK | Freq: Every day | ORAL | Status: DC | PRN

## 2019-05-13 MED ORDER — FREE WATER
200.0000 mL | Freq: Four times a day (QID) | Status: DC
  Administered 2019-05-13 – 2019-05-14 (×5): 200 mL

## 2019-05-13 MED ORDER — ACETAMINOPHEN 650 MG RE SUPP: 650.0000 mg | Freq: Four times a day (QID) | RECTAL | Status: DC | PRN

## 2019-05-13 MED ORDER — MELATONIN 3 MG PO TABS
3.0000 mg | ORAL_TABLET | Freq: Every day | ORAL | Status: DC
  Administered 2019-05-14: 3 mg via GASTROSTOMY
  Filled 2019-05-13 (×3): qty 1

## 2019-05-13 MED ORDER — BISACODYL 10 MG RE SUPP: 10.0000 mg | Freq: Every day | RECTAL | Status: DC | PRN

## 2019-05-13 MED ORDER — BISACODYL 5 MG PO TBEC: 10.0000 mg | DELAYED_RELEASE_TABLET | Freq: Two times a day (BID) | ORAL | Status: DC

## 2019-05-13 MED ORDER — OXYCODONE HCL 5 MG PO TABS
10.0000 mg | ORAL_TABLET | Freq: Four times a day (QID) | ORAL | Status: DC
  Administered 2019-05-13 – 2019-05-14 (×5): 10 mg
  Filled 2019-05-13 (×5): qty 2

## 2019-05-13 MED ORDER — NON FORMULARY: 3.0000 mg | Freq: Every day | Status: DC

## 2019-05-13 MED ORDER — ATORVASTATIN CALCIUM 40 MG PO TABS
40.0000 mg | ORAL_TABLET | Freq: Every day | ORAL | Status: DC
  Administered 2019-05-13 – 2019-05-14 (×2): 40 mg
  Filled 2019-05-13 (×2): qty 1

## 2019-05-13 MED ORDER — AMMONIUM LACTATE 12 % EX LOTN
TOPICAL_LOTION | CUTANEOUS | Status: DC | PRN
  Filled 2019-05-13: qty 225

## 2019-05-13 MED ORDER — SODIUM BICARBONATE 650 MG PO TABS
650.0000 mg | ORAL_TABLET | Freq: Every day | ORAL | Status: DC
  Administered 2019-05-13 – 2019-05-14 (×2): 650 mg
  Filled 2019-05-13 (×3): qty 1

## 2019-05-13 MED ORDER — SERTRALINE HCL 50 MG PO TABS
25.0000 mg | ORAL_TABLET | Freq: Every day | ORAL | Status: DC
  Administered 2019-05-13 – 2019-05-14 (×2): 25 mg
  Filled 2019-05-13 (×3): qty 1

## 2019-05-13 MED ORDER — ENOXAPARIN SODIUM 40 MG/0.4ML ~~LOC~~ SOLN
40.0000 mg | SUBCUTANEOUS | Status: DC
  Administered 2019-05-13 – 2019-05-14 (×2): 40 mg via SUBCUTANEOUS
  Filled 2019-05-13 (×2): qty 0.4

## 2019-05-13 MED ORDER — LIDOCAINE 5 % EX PTCH
1.0000 | MEDICATED_PATCH | CUTANEOUS | Status: DC
  Administered 2019-05-14: 1 via TRANSDERMAL
  Filled 2019-05-13 (×3): qty 1

## 2019-05-13 MED ORDER — ALPRAZOLAM 0.25 MG PO TABS: 0.2500 mg | ORAL_TABLET | Freq: Two times a day (BID) | ORAL | Status: DC | PRN

## 2019-05-13 NOTE — ED Notes (Signed)
ED TO INPATIENT HANDOFF REPORT  ED Nurse Name and Phone #: Jeannett Senior [phone]  S Name/Age/Gender Pamela George 58 y.o. female Room/Bed: 043C/043C  Code Status   Code Status: Full Code  Home/SNF/Other Home Patient oriented to: self, place and situation Is this baseline? Yes   Triage Complete: Triage complete  Chief Complaint brady  Triage Note Pt arrives via carelink from [facility] nursing facility. Reports that pt is bradycardic this morning. Per carelink, this is not new. The facility was concerned because they do no have the ability to monitor her heart rate continuously. Pt alert/oriented. Quadraplegic, trach vent dependent, and has C collar in place due to spinal stenosis- per carelink.    Allergies Allergies  Allergen Reactions  . Penicillins   . Zosyn [Piperacillin Sod-Tazobactam So]     Level of Care/Admitting Diagnosis ED Disposition    ED Disposition Condition Comment   Admit  Hospital Area: [facility] [[id number]]  Level of Care: ICU [6]  Covid Evaluation: Asymptomatic Screening Protocol (No Symptoms)  Diagnosis: Bradycardia [539767]  Admitting Physician: Mirian Mo [[id number]]  Attending Physician: MCDIARMID, TODD D [[id number]]  Estimated length of stay: past midnight tomorrow  Certification:: I certify this patient will need inpatient services for at least 2 midnights  Bed request comments: This pt needs a progressive bed in the ICU.  family medicine teaching service will remain primary team.  PT Class (Do Not Modify): Inpatient [101]  PT Acc Code (Do Not Modify): Private [1]       B Medical/Surgery History Past Medical History:  Diagnosis Date  . Acute on chronic respiratory failure with hypoxia (HCC)   . Chronic diastolic heart failure (HCC)   . Morbid obesity (HCC)   . Obstructive sleep apnea   . Quadriplegia and quadriparesis [facility])    Past Surgical History:  Procedure Laterality Date  . PEG TUBE PLACEMENT    . TRACHEOSTOMY        A IV Location/Drains/Wounds Patient Lines/Drains/Airways Status   Active Line/Drains/Airways    Name:   Placement date:   Placement time:   Site:   Days:   Peripheral IV 05/13/19 Right;Lateral Forearm   05/13/19    1247    Forearm   less than 1   Tracheostomy Shiley 7 mm Cuffed   12/18/18    0310    7 mm   146   Tracheostomy Shiley Cuffed   04/14/19    1150    -   29          Intake/Output Last 24 hours No intake or output data in the 24 hours ending 05/13/19 1926  Labs/Imaging Results for orders placed or performed during the hospital encounter of 05/13/19 (from the past 48 hour(s))  TSH     Status: None   Collection Time: 05/13/19 11:31 AM  Result Value Ref Range   TSH 0.874 0.350 - 4.500 uIU/mL    Comment: Performed by a 3rd Generation assay with a functional sensitivity of <=0.01 uIU/mL. Performed at [facility], [address], [place], [place] [address]   Basic metabolic panel     Status: Abnormal   Collection Time: 05/13/19 11:31 AM  Result Value Ref Range   Sodium 146 (H) 135 - 145 mmol/L   Potassium 3.1 (L) 3.5 - 5.1 mmol/L   Chloride 106 98 - 111 mmol/L   CO2 29 22 - 32 mmol/L   Glucose, Bld 106 (H) 70 - 99 mg/dL   BUN 14  6 - 20 mg/dL   Creatinine, Ser 1.610.32 (L) 0.44 - 1.00 mg/dL   Calcium 9.3 8.9 - 09.610.3 mg/dL   GFR calc non Af Amer >60 >60 mL/min   GFR calc Af Amer >60 >60 mL/min   Anion gap 11 5 - 15    Comment: Performed at [facility], [address] [place], [place] [address]  CBC     Status: Abnormal   Collection Time: 05/13/19 11:31 AM  Result Value Ref Range   WBC 6.2 4.0 - 10.5 K/uL   RBC 4.43 3.87 - 5.11 MIL/uL   Hemoglobin 12.2 12.0 - 15.0 g/dL   HCT 98.140.9 19.136.0 - 47.846.0 %   MCV 92.3 80.0 - 100.0 fL   MCH 27.5 26.0 - 34.0 pg   MCHC 29.8 (L) 30.0 - 36.0 g/dL   RDW 29.515.7 (H) 62.111.5 - 30.815.5 %   Platelets 247 150 - 400 K/uL   nRBC 0.0 0.0 - 0.2 %    Comment: Performed at [facility], [address] [place], [place] [address]   Troponin I (High Sensitivity)     Status: None   Collection Time: 05/13/19 11:31 AM  Result Value Ref Range   Troponin I (High Sensitivity) 4 <18 ng/L    Comment: (NOTE) Elevated high sensitivity troponin I (hsTnI) values and significant  changes across serial measurements may suggest ACS but many other  chronic and acute conditions are known to elevate hsTnI results.  Refer to the "Links" section for chest pain algorithms and additional  guidance. Performed at [facility], [address] [place], [place] [address]   Urinalysis, Routine w reflex microscopic     Status: Abnormal   Collection Time: 05/13/19 12:34 PM  Result Value Ref Range   Color, Urine AMBER (A) YELLOW    Comment: BIOCHEMICALS MAY BE AFFECTED BY COLOR   APPearance CLOUDY (A) CLEAR   Specific Gravity, Urine 1.019 1.005 - 1.030   pH 8.0 5.0 - 8.0   Glucose, UA NEGATIVE NEGATIVE mg/dL   Hgb urine dipstick MODERATE (A) NEGATIVE   Bilirubin Urine NEGATIVE NEGATIVE   Ketones, ur 5 (A) NEGATIVE mg/dL   Protein, ur 528100 (A) NEGATIVE mg/dL   Nitrite POSITIVE (A) NEGATIVE   Leukocytes,Ua LARGE (A) NEGATIVE   RBC / HPF 21-50 0 - 5 RBC/hpf   WBC, UA 21-50 0 - 5 WBC/hpf   Bacteria, UA MANY (A) NONE SEEN   Mucus PRESENT    Non Squamous Epithelial 0-5 (A) NONE SEEN    Comment: Performed at [facility], [address] [place], [place] [address]  SARS CORONAVIRUS 2 (TAT 6-24 HRS) Nasopharyngeal Nasopharyngeal Swab     Status: None   Collection Time: 05/13/19  1:20 PM   Specimen: Nasopharyngeal Swab  Result Value Ref Range   SARS Coronavirus 2 NEGATIVE NEGATIVE    Comment: (NOTE) SARS-CoV-2 target nucleic acids are NOT DETECTED. The SARS-CoV-2 RNA is generally detectable in upper and lower respiratory specimens during the acute phase of infection. Negative results do not preclude SARS-CoV-2 infection, do not rule out co-infections with other pathogens, and should not be used as the sole basis for treatment  or other patient management decisions. Negative results must be combined with clinical observations, patient history, and epidemiological information. The expected result is Negative. Fact Sheet for Patients: HairSlick.nohttps://www.fda.gov/media/138098/download Fact Sheet for Healthcare Providers: quierodirigir.comhttps://www.fda.gov/media/138095/download This test is not yet approved or cleared by the [place] FDA and  has  been authorized for detection and/or diagnosis of SARS-CoV-2 by FDA under an Emergency Use Authorization (EUA). This EUA will remain  in effect (meaning this test can be used) for the duration of the COVID-19 declaration under Section 56 4(b)(1) of the Act, 21 U.S.C. section 360bbb-3(b)(1), unless the authorization is terminated or revoked sooner. Performed at [facility], [address] [place], [place] [address]   Troponin I (High Sensitivity)     Status: None   Collection Time: 05/13/19  1:25 PM  Result Value Ref Range   Troponin I (High Sensitivity) 4 <18 ng/L    Comment: (NOTE) Elevated high sensitivity troponin I (hsTnI) values and significant  changes across serial measurements may suggest ACS but many other  chronic and acute conditions are known to elevate hsTnI results.  Refer to the "Links" section for chest pain algorithms and additional  guidance. Performed at [facility], [address] [place], [place] [address]   HIV Antibody (routine testing w rflx)     Status: None   Collection Time: 05/13/19  3:38 PM  Result Value Ref Range   HIV Screen 4th Generation wRfx NON REACTIVE NON REACTIVE    Comment: Performed at [facility], [address] [place], [place] [address]  CBC     Status: Abnormal   Collection Time: 05/13/19  3:38 PM  Result Value Ref Range   WBC 6.3 4.0 - 10.5 K/uL   RBC 4.07 3.87 - 5.11 MIL/uL   Hemoglobin 11.5 (L) 12.0 - 15.0 g/dL   HCT 77.8 24.2 - 35.3 %   MCV 90.9 80.0 - 100.0 fL   MCH 28.3 26.0 - 34.0 pg   MCHC 31.1 30.0  - 36.0 g/dL   RDW 61.4 (H) 43.1 - 54.0 %   Platelets 267 150 - 400 K/uL   nRBC 0.0 0.0 - 0.2 %    Comment: Performed at [facility], [address] [place], [place] [address]  Creatinine, serum     Status: Abnormal   Collection Time: 05/13/19  3:38 PM  Result Value Ref Range   Creatinine, Ser 0.42 (L) 0.44 - 1.00 mg/dL   GFR calc non Af Amer >60 >60 mL/min   GFR calc Af Amer >60 >60 mL/min    Comment: Performed at [facility], [address] [place], [place] [address]  Hemoglobin A1c     Status: None   Collection Time: 05/13/19  3:39 PM  Result Value Ref Range   Hgb A1c MFr Bld 5.0 4.8 - 5.6 %    Comment: (NOTE) Pre diabetes:          5.7%-6.4% Diabetes:              >6.4% Glycemic control for   <7.0% adults with diabetes    Mean Plasma Glucose 96.8 mg/dL    Comment: Performed at [facility], [address], [place], [place] [address]  CBG monitoring, ED     Status: None   Collection Time: 05/13/19  6:07 PM  Result Value Ref Range   Glucose-Capillary 95 70 - 99 mg/dL   Dg Chest Portable 1 View  Result Date: 05/13/2019 CLINICAL DATA:  Weak  On  ventweakness EXAM: PORTABLE CHEST 1 VIEW COMPARISON:  05/10/2011 FINDINGS: Tracheostomy tube unchanged. Stable cardiac silhouette. Very low lung volumes. Mild basilar atelectasis. No overt pulmonary edema. No pneumothorax. No focal consolidation. Severe shoulder arthropathy. IMPRESSION: 1. No significant interval change. 2. Very  low lung volumes and basilar atelectasis. 3. Tracheostomy tube unchanged. 4. Severe arthropathy of the shoulders. Electronically Signed   By: Suzy Bouchard M.D.   On: 05/13/2019 13:11    Pending Labs Unresulted Labs (From admission, onward)    Start     Ordered   05/20/19 0500  Creatinine, serum  (enoxaparin (LOVENOX)    CrCl >/= 30 ml/min)  Weekly,   R    Comments: while on enoxaparin therapy    05/13/19 1525   05/14/19 0867  Basic metabolic panel  Tomorrow morning,   R     05/13/19  1525   05/14/19 0500  CBC  Tomorrow morning,   R     05/13/19 1525   05/13/19 1732  Magnesium  Once,   STAT     05/13/19 1732   05/13/19 1503  HIV4GL Save Tube  (HIV Antibody (Routine testing w reflex) panel)  Once,   STAT     05/13/19 1525          Vitals/Pain Today's Vitals   05/13/19 1345 05/13/19 1545 05/13/19 1548 05/13/19 1845  BP: 114/75 (!) 150/75 (!) 150/75 137/70  Pulse: (!) 54 (!) 44 (!) 44 (!) 46  Resp: 14 12 12 12   SpO2: 100% 100% 100% 100%  PainSc:        Isolation Precautions No active isolations  Medications Medications  enoxaparin (LOVENOX) injection 40 mg (40 mg Subcutaneous Given 05/13/19 1827)  acetaminophen (TYLENOL) tablet 650 mg (has no administration in time range)    Or  acetaminophen (TYLENOL) suppository 650 mg (has no administration in time range)  sodium bicarbonate tablet 650 mg (650 mg Per Tube Given 05/13/19 1826)  polyethylene glycol (MIRALAX / GLYCOLAX) packet 17 g (has no administration in time range)  sertraline (ZOLOFT) tablet 25 mg (25 mg Per Tube Given 05/13/19 1826)  lidocaine (LIDODERM) 5 % 1 patch (has no administration in time range)  atorvastatin (LIPITOR) tablet 40 mg (40 mg Per Tube Given 05/13/19 1825)  ammonium lactate (LAC-HYDRIN) 12 % lotion (has no administration in time range)  free water 200 mL (200 mLs Per Tube Given 05/13/19 1803)  baclofen (LIORESAL) tablet 10 mg (10 mg Per Tube Not Given 05/13/19 1802)  oxyCODONE (Oxy IR/ROXICODONE) immediate release tablet 10 mg (10 mg Per Tube Given 05/13/19 1834)  gabapentin (NEURONTIN) 300 MG/6ML solution 300 mg (300 mg Per Tube Not Given 05/13/19 1803)  ALPRAZolam (XANAX) tablet 0.25 mg (has no administration in time range)  fentaNYL (DURAGESIC) 75 MCG/HR 1 patch (has no administration in time range)  bisacodyl (DULCOLAX) suppository 10 mg (has no administration in time range)  bisacodyl (DULCOLAX) EC tablet 10 mg (has no administration in time range)  nutrition supplement  (JUVEN) (JUVEN) powder packet 1 packet (1 packet Oral Not Given 05/13/19 1804)  insulin aspart (novoLOG) injection 0-9 Units (0 Units Subcutaneous Not Given 05/13/19 1813)  lactase (LACTAID) tablet 6,000 Units (has no administration in time range)  Melatonin TABS 3 mg (has no administration in time range)  potassium chloride SA (KLOR-CON) CR tablet 40 mEq (has no administration in time range)  ondansetron (ZOFRAN) tablet 4 mg (4 mg Per Tube Given 05/13/19 1825)    Mobility non-ambulatory Low fall risk   Focused Assessments Cardiac Assessment Handoff:  Cardiac Rhythm: Sinus bradycardia Lab Results  Component Value Date   TROPONINI <0.03 12/18/2018   No results found for: DDIMER Does the Patient currently have chest pain? No     R Recommendations: See Admitting  Provider Note  Report given to:   Additional Notes:

## 2019-05-13 NOTE — ED Provider Notes (Signed)
[facility] EMERGENCY DEPARTMENT Provider Note   CSN: [id number] Arrival date & time: 05/13/19  1035     History   Chief Complaint Chief Complaint  Patient presents with  . Bradycardia    HPI Pamela George is a 58 y.o. female.    Level 5 caveat due to minimally verbal status/ventilated. HPI Patient reportedly sent in from [facility] long-term [facility] facility.  Reportedly sent in for bradycardia.  Reportedly has been bradycardic for days now.  Documentation from [facility] did not show information why she was sent in.  Transport team states that they were told that she needed admission to a telemetry bed because there is no telemetry monitoring at the hospital.  Patient is able to nod yes or no to some questions.  Nodded that she did feel good.  She was seen in the ER around a month ago for bradycardia reportedly had a heart rate somewhat down to 30s but was only in the 50s while she was here.  EKG and paperwork from [facility] show heart rate in the upper 40s.  She was seen in the ER 3 days ago and appears to have had a heart rate around 80 at that time.  She was sent in 3 days ago for periods of unresponsiveness and hypoxemia. Past Medical History:  Diagnosis Date  . Acute on chronic respiratory failure with hypoxia (HCC)   . Chronic diastolic heart failure (HCC)   . Morbid obesity (HCC)   . Obstructive sleep apnea   . Quadriplegia and quadriparesis [facility])     Patient Active Problem List   Diagnosis Date Noted  . Acute on chronic respiratory failure with hypoxia (HCC)   . Quadriplegia and quadriparesis (HCC)   . Obstructive sleep apnea   . Chronic diastolic heart failure (HCC)   . Morbid obesity (HCC)     Past Surgical History:  Procedure Laterality Date  . PEG TUBE PLACEMENT    . TRACHEOSTOMY       OB History   No obstetric history on file.      Home Medications    Prior to Admission medications   Not on File    Family History History reviewed.  No pertinent family history.  Social History Social History   Tobacco Use  . Smoking status: Never Smoker  . Smokeless tobacco: Never Used  Substance Use Topics  . Alcohol use: Not Currently  . Drug use: Never     Allergies   Penicillins and Zosyn [piperacillin sod-tazobactam so]   Review of Systems Review of Systems  Unable to perform ROS: Patient nonverbal     Physical Exam Updated Vital Signs BP 123/71   Pulse (!) 47   Resp 13   SpO2 100%   Physical Exam Vitals signs and nursing note reviewed.  Eyes:     Pupils: Pupils are equal, round, and reactive to light.  Neck:     Comments: Patient has a trach. Cardiovascular:     Rate and Rhythm: Bradycardia present.  Pulmonary:     Effort: Pulmonary effort is normal.  Abdominal:     Tenderness: There is no abdominal tenderness.     Comments: G-tube in place.  Musculoskeletal:     Right lower leg: Edema present.     Left lower leg: Edema present.  Skin:    General: Skin is warm.  Neurological:     Mental Status: She is alert.     Comments: Patient will look to voice and nod  yes or no to some questions.  Quadriparesis from spinal stenosis.      ED Treatments / Results  Labs (all labs ordered are listed, but only abnormal results are displayed) Labs Reviewed  BASIC METABOLIC PANEL - Abnormal; Notable for the following components:      Result Value   Sodium 146 (*)    Potassium 3.1 (*)    Glucose, Bld 106 (*)    Creatinine, Ser 0.32 (*)    All other components within normal limits  CBC - Abnormal; Notable for the following components:   MCHC 29.8 (*)    RDW 15.7 (*)    All other components within normal limits  TSH  URINALYSIS, ROUTINE W REFLEX MICROSCOPIC  TROPONIN I (HIGH SENSITIVITY)  TROPONIN I (HIGH SENSITIVITY)    EKG EKG Interpretation  Date/Time:  Thursday May 13 2019 10:47:56 EDT Ventricular Rate:  46 PR Interval:    QRS Duration: 76 QT Interval:  477 QTC Calculation: 418 R  Axis:   30 Text Interpretation:  Sinus bradycardia (less than 50 BPm) Low voltage, precordial leads Borderline T abnormalities, anterior leads Confirmed by Davonna Belling [id number]) on 05/13/2019 10:57:19 AM   Radiology No results found.  Procedures Procedures (including critical care time)  Medications Ordered in ED Medications - No data to display   Initial Impression / Assessment and Plan / ED Course  I have reviewed the triage vital signs and the nursing notes.  Pertinent labs & imaging results that were available during my care of the patient were reviewed by me and considered in my medical decision making (see chart for details).        Patient sent from [facility]-term vent facility with bradycardia.  Reportedly bradycardia needed cardiac this morning.  Reportedly was sent for admission to telemetry bed but discussed with hospitalist they have no record of admission being done.  I have been unable to get through to anyone at [facility] to discuss the patient.  Pulse rate is around 45 here.  Potentially asymptomatic but did have recent visit to the ER with unresponsive episode that in theory could have been bradycardia related.  I feel with the recent unresponsiveness patient benefit for admission to the hospital.  He was seen in the ER a month ago with reported bradycardia.  However did not had a rates below the 50s while she was in the ER.  Reportedly was in the 30s at the nursing home.  Final Clinical Impressions(s) / ED Diagnoses   Final diagnoses:  Bradycardia  Ventilator dependent [facility])    ED Discharge Orders    None       Davonna Belling, MD 05/13/19 1307

## 2019-05-13 NOTE — ED Triage Notes (Signed)
Pt arrives via carelink from [facility] facility. Reports that pt is bradycardic this morning. Per carelink, this is not new. The facility was concerned because they do no have the ability to monitor her heart rate continuously. Pt alert/oriented. Quadraplegic, trach vent dependent, and has C collar in place due to spinal stenosis- per carelink.

## 2019-05-13 NOTE — H&P (Addendum)
[facility] Admission History and Physical Service Pager: [phone]  Patient name: Pamela George Medical record number: [id number] Date of birth: 03/07/1961 Age: 58 y.o. Gender: female  Primary Care Provider: Ellin Saba, MD Consultants: Cardiology Code Status: Full Preferred Emergency Contact:   Chief Complaint:  bradycardia  Assessment and Plan: SEPTEMBER MORMILE is a 58 y.o. female Libby Maw from [facility] with concern for bradycardia. PMH is significant for quadriplegia, trach/vent dependent, chronic heart failure.  Asymptomatic bradycardia  Unclear etiology.  Labs significant for hypokalemia 3.1.  EKG sinus bradycardia.  Troponin negative x2.  On exam patient bradycardic.  Heart rate 40s to 50s.  Denies any chest pain, dizziness, loss of consciousness.  Possible causes of bradycardia include medications such as calcium valvotomy have offered but this seems unlikely as patient not currently on these meds.  Considered sick sinus syndrome, AV nodal block as possible causes of bradycardia. Considered hypothyroidism however seems unlikely as TSH within normal limits.  Also considered on potassium as cause for bradycardia.  Her chronic pain medication does include high doses of opioids, this may be contributing to bradycardia although these seem to be chronic medications since at least 08/2018 and not likely contributing to acute bradycardia.  Will admit for inpatient assessment primarily due to the history of PEA arrest.  We will continue to monitor closely and follow with cardiology recommendations. -Admit to ICU, attending Dr. Vikki Ports -Consult cardiology, appreciate recommendations -Curbside CCM for vent management -Cardiac monitoring -Vital signs per unit -Replete electrolytes as needed -BMP in a.m. -CBC in a.m.  Quadriplegia secondary to severe cervical stenosis Chronic trach and vent dependent.  Home medication Gabapentin 300 mg every 8 hours,  Fentanyl Duragesic 75 mcg every 72 hours, oxycodone IR 10 mg every 6 hours -Appreciate CCM for vent management -Continue gabapentin -Continue fentanyl -Continue oxycodone IR   Sacral decubitus ulcer Assessed on admission.  Patient is found to have a sacral decubitus ulcer roughly 3 cm x 5 cm.  Appropriate wound dressings with gauze in place.  No evidence of purulence, erythema.  Low suspicion for infection.  Did not probe the full depth of the ulcer. -Consult to wound care  G-tube dependent All of Pamela George's medications and nutrition, through her G-tube.  Per medication reconciliation from SNF, her nutrition includes Juven and prostat daily.  We will consult nutrition for appropriate nutrition recommendations to ensure that the appropriate nutritional supplements are provided. -Consult nutrition  Anxiety Home medication includes sertraline 25 mg daily. -Continue sertraline 25 mg daily  Constipation Medication reconciliation included MiraLAX, Dulcolax tablet and Dulcolax suppository. -Continue MiraLAX and Dulcolax  Diabetes, type II Medication reconciliation shows sliding scale insulin.  Will transition to sensitive sliding scale insulin for this hospitalization. -SSI sensitive -Monitor CBGs  FEN/GI:  -NPO -PEG tube Prophylaxis:  -Lovenox 40 daily  Disposition: SNF medically stable  History of Present Illness:  SALSABEEL GORELICK is a 58 y.o. female presenting with bradycardia.    Patient comes from [place] home for periods of bradycardia.  The nurse caring for Pamela George was contacted and noted that Pamela George was first found to be bradycardic this morning into the 40s.  Per transport team she was sent to [facility] health emergency for telemetry monitoring.  Currently the ED provider she was seen in the ER about a month ago with heart rate of 30s but was only seen to have heart rate of 50s while in the ED.  She was seen  in the ER 3 days ago and was noted to have  heart rate in the 80s at that time.  Was also noted that she was having periods of unresponsiveness and hypoxemia.  Of note she has a history of PEA arrest.  Patient able to nod to yes and no questions.  She nods no to having any chest pain, increasing shortness of breath, abdominal pain, nausea or vomiting.  She nods yes to some urinary frequency.  She is thinks that she may have some palpitations.  Unsure if any diarrhea or constipation.  She is not currently having any dizziness.  In the ED heart rate noted to be 42, BP 104/91, trach vent dependent PRVC FiO2 30%, PEEP 5, rate 12 and tidal volume 500.  EKG sinus bradycardia.  Chest x-ray shows no interval change, very low lung volumes and basilar atelectasis.  Tracheostomy tube is unchanged.  Severe arthropathy of the shoulder.  Review Of Systems: Per HPI with the following additions:   Review of Systems  Constitutional: Negative for chills, fever and malaise/fatigue.  Eyes: Negative for double vision.  Respiratory: Negative for shortness of breath.   Cardiovascular: Positive for palpitations. Negative for chest pain.  Gastrointestinal: Negative for abdominal pain, constipation, diarrhea, nausea and vomiting.  Genitourinary: Positive for frequency. Negative for dysuria.  Neurological: Positive for headaches. Negative for tremors.    Patient Active Problem List   Diagnosis Date Noted  . Acute on chronic respiratory failure with hypoxia (HCC)   . Quadriplegia and quadriparesis (HCC)   . Obstructive sleep apnea   . Chronic diastolic heart failure (HCC)   . Morbid obesity (HCC)     Past Medical History: Past Medical History:  Diagnosis Date  . Acute on chronic respiratory failure with hypoxia (HCC)   . Chronic diastolic heart failure (HCC)   . Morbid obesity (HCC)   . Obstructive sleep apnea   . Quadriplegia and quadriparesis [facility])     Past Surgical History: Past Surgical History:  Procedure Laterality Date  . PEG TUBE PLACEMENT     . TRACHEOSTOMY      Social History: Social History   Tobacco Use  . Smoking status: Never Smoker  . Smokeless tobacco: Never Used  Substance Use Topics  . Alcohol use: Not Currently  . Drug use: Never   Additional social history: Lives in nursing home Please also refer to relevant sections of EMR.  Family History: History reviewed. No pertinent family history.   Allergies and Medications: Allergies  Allergen Reactions  . Penicillins   . Zosyn [Piperacillin Sod-Tazobactam So]    No current facility-administered medications on file prior to encounter.    No current outpatient medications on file prior to encounter.    Objective: BP 123/71   Pulse (!) 47   Resp 13   SpO2 100%  Exam: General: Obese woman lying in bed unable to move her extremities.  C-collar in place.  Trach and vent dependent.  Able to communicate primarily with nodding and mouthing short sentences.  No acute distress. Cardiovascular: Bradycardia, regular rhythm.  No murmurs/rubs/gallops. Respiratory: Diminished breath sounds on anterior auscultation.  No crackles, no rhonchi  Gastrointestinal: Soft, nontender, nondistended, bowel sounds present MSK: Severe weakness in all extremities, bilateral lower extremity edema Derm: Large coccyx open ulcer, Neuro: Alert, nods to yes and no questions Psych: Pleasant and cooperative  Labs and Imaging: CBC BMET  Recent Labs  Lab 05/13/19 1131  WBC 6.2  HGB 12.2  HCT 40.9  PLT 247  Recent Labs  Lab 05/13/19 1131  NA 146*  K 3.1*  CL 106  CO2 29  BUN 14  CREATININE 0.32*  GLUCOSE 106*  CALCIUM 9.3     EKG:  Sinus bradycardia (less than 50 BPm) Low voltage, precordial leads Borderline T abnormalities, anterior leads Confirmed by Benjiman CorePickering, Nathan [phone]([id number]) on 05/13/2019 10:57:19 AM   Dg Chest Portable 1 View  Result Date: 05/13/2019 CLINICAL DATA:  Weak  On  ventweakness EXAM: PORTABLE CHEST 1 VIEW COMPARISON:  05/10/2011 FINDINGS:  Tracheostomy tube unchanged. Stable cardiac silhouette. Very low lung volumes. Mild basilar atelectasis. No overt pulmonary edema. No pneumothorax. No focal consolidation. Severe shoulder arthropathy. IMPRESSION: 1. No significant interval change. 2. Very low lung volumes and basilar atelectasis. 3. Tracheostomy tube unchanged. 4. Severe arthropathy of the shoulders. Electronically Signed   By: Genevive BiStewart  Edmunds M.D.   On: 05/13/2019 13:11    Dana AllanWalsh, Tanya, MD 05/13/2019, 1:45 PM PGY-1, [facility] FPTS Intern pager: [phone], text pages welcome  FPTS Upper-Level Resident Addendum   I have independently interviewed and examined the patient. I have discussed the above with the original author and agree with their documentation. My edits for correction/addition/clarification are in blue. Please see also any attending notes.    Mirian MoFrank, Mckenlee Mangham MD PGY-2, [facility] 05/13/2019 10:32 PM  FPTS Service pager: [phone] (text pages welcome through AMION)

## 2019-05-13 NOTE — ED Notes (Signed)
Pt sister Verdene Lennert [phone]

## 2019-05-13 NOTE — Consult Note (Addendum)
Cardiology Consultation:   Patient ID: Pamela RootsDeborah J Luty MRN: [id number]; DOB: 08/25/1960  Admit date: 05/13/2019 Date of Consult: 05/13/2019  Primary Care Provider: Zollie Scalearwish, Amir M, MD Primary Cardiologist: No primary care provider on file. NEW Primary Electrophysiologist:  None   Patient Profile:   Pamela George is a 58 y.o. female with a hx of quadriplegia with respiratory failure- trach and vent dependent, spinal stenosis, DM-2, GERD, HTN, lymphedema, diastolic HF, OSA with obesity [facility]ilation [facility] due to bradycardia, in the 30s, this occurred in Sept 2020 and 05/10/19 with episode of being unresponsive  on a Tbar and hypoxic who is being seen today for the evaluation of bradycardia   at the request of Dr. McDiarmid.  History of Present Illness:   Ms. Raul Dellston with now 3 episodes of bradycardiac into the 30s.  One episode with unresponsiveness but she was on T bar and hypoxic at the time.  Pt with above hx and quadriplegia since 06/2018, vent dependent with trach.  [facility], hx of PEA arrest, follow up monitor with PVCs.  Staff at [facility] were concerned because they could not monitor her.   She wears C collar for spinal stenosis.C3-C5 severe cervical stenosis with cord edema and cord compression with resultant quadriparesis.    FP will admit.    EKG:  The EKG was personally reviewed and demonstrates:  Sinus Brady at 46, no acute ST changes Telemetry:  Telemetry was personally reviewed and demonstrates:  SR mostly SB 40-50s on oc PVCs bigeminy    Na 146, k+ 3.1, BUN 14, Cr 0.32,  Troponin 4, 4 Hgb 12.2, plts 247 TSH 0.874 +UTI, + lge leukocytes, nitrite +, many bacteria  CXR P  IMPRESSION: 1. No significant interval change. 2. Very low lung volumes and basilar atelectasis. 3. Tracheostomy tube unchanged. 4. Severe arthropathy of the shoulders.  COVID pending, but 05/11/19 neg.   Heart Pathway Score:     Past  Medical History:  Diagnosis Date   Acute on chronic respiratory failure with hypoxia (HCC)    Chronic diastolic heart failure (HCC)    Morbid obesity (HCC)    Obstructive sleep apnea    Quadriplegia and quadriparesis (HCC)     Past Surgical History:  Procedure Laterality Date   PEG TUBE PLACEMENT     TRACHEOSTOMY       Home Medications:  Prior to Admission medications   Not on File  Has on fentanyl patch  Cannot find meds on currently   Inpatient Medications: Scheduled Meds:  atorvastatin  40 mg Per Tube q1800   baclofen  10 mg Per Tube BID   bisacodyl  10 mg Oral BID   enoxaparin (LOVENOX) injection  40 mg Subcutaneous Q24H   [START ON 05/14/2019] fentaNYL  1 patch Transdermal Q72H   free water  200 mL Per Tube Q6H   gabapentin  300 mg Per Tube TID   insulin aspart  0-9 Units Subcutaneous TID WC   [START ON 05/14/2019] lidocaine  1 patch Transdermal Q24H   Melatonin  3 mg Gastric Tube QHS   nutrition supplement (JUVEN)  1 packet Oral BID BM   ondansetron  4 mg Per Tube Once   oxyCODONE  10 mg Per Tube Q6H   potassium chloride  40 mEq Oral Once   sertraline  25 mg Per Tube Daily   sodium bicarbonate  650 mg Per Tube Daily   Continuous Infusions:  PRN Meds:  acetaminophen **OR** acetaminophen, ALPRAZolam, ammonium lactate, bisacodyl, lactase, polyethylene glycol  Allergies:    Allergies  Allergen Reactions   Penicillins    Zosyn [Piperacillin Sod-Tazobactam So]     Social History:   Social History   Socioeconomic History   Marital status: Single    Spouse name: Not on file   Number of children: Not on file   Years of education: Not on file   Highest education level: Not on file  Occupational History   Not on file  Social Needs   Financial resource strain: Not on file   Food insecurity    Worry: Not on file    Inability: Not on file   Transportation needs    Medical: Not on file    Non-medical: Not on file  Tobacco  Use   Smoking status: Never Smoker   Smokeless tobacco: Never Used  Substance and Sexual Activity   Alcohol use: Not Currently   Drug use: Never   Sexual activity: Not on file  Lifestyle   Physical activity    Days per week: Not on file    Minutes per session: Not on file   Stress: Not on file  Relationships   Social connections    Talks on phone: Not on file    Gets together: Not on file    Attends religious service: Not on file    Active member of club or organization: Not on file    Attends meetings of clubs or organizations: Not on file    Relationship status: Not on file   Intimate partner violence    Fear of current or ex partner: Not on file    Emotionally abused: Not on file    Physically abused: Not on file    Forced sexual activity: Not on file  Other Topics Concern   Not on file  Social History Narrative   Not on file    Family History:    Family History  Problem Relation Age of Onset   Hypertension Mother    Hypertension Father      ROS: difficult to get history, I read pt's lips. Please see the history of present illness.  General:no colds or fevers, no weight changes Skin:no rashes or ulcers HEENT:no blurred vision, no congestion CV:see HPI PUL:see HPI GI:no diarrhea constipation or melena, no indigestion, has PEG tube for nutrition  GU:no hematuria, no dysuria MS:no joint pain, no claudication,  Neuro:no syncope, no lightheadedness, quadriplegia and quadriparesis, cervical stenosis  Endo:+ hx diabetes, no thyroid disease  All other ROS reviewed and negative.     Physical Exam/Data:   Vitals:   05/13/19 1215 05/13/19 1345 05/13/19 1545 05/13/19 1548  BP: 123/71 114/75 (!) 150/75 (!) 150/75  Pulse: (!) 47 (!) 54 (!) 44 (!) 44  Resp: SpO2: 100% 100% 100% 100%   No intake or output data in the 24 hours ending 05/13/19 1705 Last 3 Weights 12/18/2018  Weight (lbs) 155 lb  Weight (kg) 70.308 kg     There is no height  or weight on file to calculate BMI.  General:  Well nourished, well developed, in no acute distress, though appears anxious HEENT: normal Lymph: no adenopathy Neck: no JVD though unable to eval well due to cervical collar Vascular: unable to access carotid bruits; pedal pulses 1+ bilaterally Cardiac:  normal S1, S2; RRR; slow no murmur gallup rub orclick Lungs:  clear to auscultation bilaterally, no wheezing, rhonchi or rales  Abd:  soft, nontender, no hepatomegaly  PEG tube Lt mid to upper abd Ext: tr lower ext edema Musculoskeletal:  + foot drop, can move fingers on Rt hand, otherwise no movement Skin: warm and dry  Neuro:  CNs alert and oriented, follows commands, answers questions with mild nod and I can read her lips. Quadriplegia and quadriparesis  Psych:  Normal to flat affect   Relevant CV Studies: Echo 06/29/18 Summary:  1. The left ventricular chamber size is normal. 2. There is normal left ventricular systolic function. 3. Global left ventricular wall motion and contractility are within normal limits. 4. The left atrial chamber size is normal. 5. Mild aortic regurgitation is present. 6. There is a trivial amount of mitral regurgitation. 7. There is trivial tricuspid regurgitation.  Left Ventricle: The left ventricular chamber size is normal. There is normal left ventricular systolic function. Global left ventricular wall motion and contractility are within normal limits. The ejection fraction is estimated to be 55%.   Left Atrium: The left atrial chamber size is normal. The left atrial volume index of 28.7 mL/m2 is normal.   Right Ventricle: The right ventricular cavity size is normal. The right ventricular global systolic function is normal.   Right Atrium: The right atrium is normal.   Aortic Valve: The aortic valve is trileaflet. There is mild sclerosis of the aortic valve cusps. Mild aortic regurgitation is present. There is no evidence of  aortic stenosis.   Mitral Valve: The mitral valve leaflets appear normal. There is a trivial amount of mitral regurgitation.   Tricuspid Valve: The tricuspid valve leaflets are morphologically normal. There is trivial tricuspid regurgitation.   Pulmonic Valve: The pulmonic valve is not well visualized. There is no evidence of pulmonic regurgitation.   Pericardium: The pericardium appears normal.   Aorta: The aortic root appears normal.   Venous: The inferior vena cava appears normal.   Miscellaneous: No evidence of thrombus, intra-cardiac shunting or vegetation.  Measurements  Chambers Name             Value     Normal Range    IVSd (2D)          0.7 cm    (0.6 - 1.1)    LVIDd (2D)          5 cm     -          LVIDs (2D)          3.6 cm    (2.2 - 4)     LVPWd (2D)          0.8 cm    (0.6 - 1.1)    Ao root diameter (2D)    3 cm     -          Ascending Ao         2.7 cm    -          LA dimension 2D       3.4 cm    -          LA ESV SP 4CH (MOD)     47 ml     -          LA ESV SP 2CH (MOD)     49 ml     -          LA ESV BP (MOD)       51 ml     -  LA ESV BP (MOD) index    28.7 ml/m2  (16 - 28)     LV FS (Teichholz) (2D)    28 %     (20 - 80)      Aortic Valve Name             Value     Normal Range    AR PHT            694 msec   -           Mitral Valve Name             Value     Normal Range    MV E-wave Vmax        0.55 m/sec  -          MV A-wave Vmax        0.78 m/sec  -          MV deceleration time     187 msec   (160 - 240)    MV E:A ratio         0.7 ratio   (1.1 - 1.5)    LV lateral e' Vmax      0.1  m/sec   -          LV septal e' Vmax      0.06 m/sec  -          LV E:e' lateral ratio    5.7 ratio   -          LV E:e' septal ratio     8.7 ratio   -           Volumes Name             Value     Normal Range    EF Teichholz (2D)      53.9 %    -           Nuc study 06/30/18 Perfusion Interpretation: TID Ratio 1.17. Perfusion at stress and rest conditions was normal.   Laboratory Data:  High Sensitivity Troponin:   Recent Labs  Lab 05/13/19 1131 05/13/19 1325  TROPONINIHS 4 4     Chemistry Recent Labs  Lab 05/10/19 1600 05/10/19 1610 05/13/19 1131 05/13/19 1538  NA 147* 149* 146*  --   K 3.2* 3.3* 3.1*  --   CL 107  --  106  --   CO2 30  --  29  --   GLUCOSE 181*  --  106*  --   BUN 25*  --  14  --   CREATININE 0.39*  --  0.32* 0.42*  CALCIUM 9.3  --  9.3  --   GFRNONAA >60  --  >60 >60  GFRAA >60  --  >60 >60  ANIONGAP 10  --  11  --     No results for input(s): PROT, ALBUMIN, AST, ALT, ALKPHOS, BILITOT in the last 168 hours. Hematology Recent Labs  Lab 05/10/19 1600 05/10/19 1610 05/13/19 1131 05/13/19 1538  WBC 8.6  --  6.2 6.3  RBC 4.25  --  4.43 4.07  HGB 11.6* 12.2 12.2 11.5*  HCT 39.9 36.0 40.9 37.0  MCV 93.9  --  92.3 90.9  MCH 27.3  --  27.5 28.3  MCHC 29.1*  --  29.8* 31.1  RDW 15.6*  --  15.7* 15.8*  PLT 294  --  247 267   BNPNo results for input(s): BNP,  PROBNP in the last 168 hours.  DDimer No results for input(s): DDIMER in the last 168 hours.   Radiology/Studies:  Dg Chest Portable 1 View  Result Date: 05/13/2019 CLINICAL DATA:  Weak  On  ventweakness EXAM: PORTABLE CHEST 1 VIEW COMPARISON:  05/10/2011 FINDINGS: Tracheostomy tube unchanged. Stable cardiac silhouette. Very low lung volumes. Mild basilar atelectasis. No overt pulmonary edema. No pneumothorax. No focal consolidation. Severe shoulder arthropathy. IMPRESSION: 1. No significant interval  change. 2. Very low lung volumes and basilar atelectasis. 3. Tracheostomy tube unchanged. 4. Severe arthropathy of the shoulders. Electronically Signed   By: Suzy Bouchard M.D.   On: 05/13/2019 13:11   Dg Chest Portable 1 View  Result Date: 05/10/2019 CLINICAL DATA:  Intermittent hypoxemia. EXAM: PORTABLE CHEST 1 VIEW COMPARISON:  Dec 18, 2018 FINDINGS: Tracheostomy tube in stable position. Enlarged cardiac silhouette. Low lung volumes. Bilateral lower lobe streaky airspace opacities may represent atelectasis versus peribronchial airspace consolidation. Osseous structures are without acute abnormality. Soft tissues are grossly normal. IMPRESSION: 1. Low lung volumes. 2. Bilateral lower lobe streaky airspace opacities may represent atelectasis versus peribronchial airspace consolidation. 3. Enlarged cardiac silhouette. Electronically Signed   By: Fidela Salisbury M.D.   On: 05/10/2019 16:07    Assessment and Plan:   1. Bradycardia, sinusbrady here - at [facility] down to 30s. No rate lowering meds.  Monitor - if still brady with normal electrolytes and TSH is normal may be a candidate for PPM but that would be conversation with pt and family.  Will check echo, has enlarged cardiac silhouette.  Last echo normal 06/2018 2. Hypokalemia replace to 4.0  Check Mg+  3. Paraplegic due to spinal stenosis with C collar, Trach and vent. 4. Hx of OSA but now on vent. 5. DM-2 per primary team with hgb A1c 5.0 now. 6. HTN 150/75 to 132/80 no rate lowering meds.       For questions or updates, please contact Nathalie Please consult [email or web address] for contact info under   Signed, Cecilie Kicks, NP  05/13/2019 5:05 PM   Patient seen and examined. Agree with assessment and plan.  Ms. Shawnna Pancake is a 58 year old African-American female who has a history of quadriplegia with respiratory failure status post tracheostomy and ventilator dependent secondary to significant spinal stenosis.  She has a  history of hypertension, type 2 diabetes mellitus, previous diastolic heart failure, and previously had  obstructive sleep apnea and obesity/hypoventilation.  She has been a resident of [facility] since December 2019.  She has had several episodes where she was recently transported to [facility] ER.  She wears a c-collar for C3-C5 severe spinal stenosis with cord edema and cord compression resulting in her quadriparesis.  She was seen in the ER approximately a month ago for bradycardia at [facility] but while in the ER heart rate was in the 50s.  She was last evaluated in the ER on May 10, 2019 after an episode of being unresponsive on T-bar and hypoxic resolved.  At the time there was a question of possible mucous plugging in the etiology is hypoxia improved following suctioning.  Truman Hayward, again today she developed bradycardia.  Was advised from [facility] that she needed admission to telemetry since there was no opportunity to monitor her at [facility].  Presently, in the emergency room she is in sinus bradycardia with a heart rate at 47.  She is alert.  She is able to communicate with a whisper.  She is quadriplegic.  Letter was in place  as was tracheostomy with T-bar.  I did not hear rales or wheezes.  Rhythm was regular with a faint 1/6 systolic murmur.  She had central adiposity.  Legs were flaccid.  Gwyndolyn Kaufman is notable for pH 7.44, PCO2 49.2, PO2 123 and O2 sat 99%.  She is hypokalemic with a potassium of 3.1.  Creatinine 0.42.  Hemoglobin 11.5/hematocrit 37.0.  Glucose 106.  Hemoglobin A1c 5.0.  TSH 0.874.  ECG shows bradycardia at 46 with nonspecific T changes anteriorly.  Intervals are normal.  There is no ectopy.  A chest x-ray from May 10, 2019 showed low lung volumes, possible atelectasis and raised the concern for an enlarged cardiac silhouette.  This was not apparently appreciated on the chest x-ray today.  At present, recommend potassium supplementation to a K of 4.0, check magnesium level and keep  magnesium 2.0.  We will check an echo Doppler study for further assessment of cardiac size and function.  Monitor on telemetry.  Lennette Bihari, MD, [facility] 05/13/2019 5:52 PM

## 2019-05-13 NOTE — Progress Notes (Signed)
Family Medicine Teaching Service Daily Progress Note Intern Pager: [phone]  Patient name: Pamela George Medical record number: [id number] Date of birth: Jun 07, 1961 Age: 58 y.o. Gender: female  Primary Care Provider: Zollie Scale, MD Consultants: Cardiology Code Status: Full  Pt Overview and Major Events to Date:  10/15-admitted for asymptomatic bradycardia  Assessment and Plan: Pamela George is a 58 y.o. female Pamela George from [facility] with concern for bradycardia. PMH is significant for quadriplegia, trach/vent dependent, chronic heart failure.  Asymptomatic bradycardia  She continues to be bradycardic.  Heart rate 40s -50s.  Antimanic.  Denies any dizziness, chest pain, belly pain.  Potassium now 4.0.  Magnesium 2 g given overnight. -Cardiology evaluated.  If persistent bradycardia when electrolytes normal, she may be candidate for PPM after discussion with and family.  -Maintain potassium 4.0 and magnesium 2.0. -Curbside CCM for vent management -Cardiac monitoring -Vital signs per unit -Replete electrolytes as needed -BMP in a.m.  Quadriplegia secondary to severe cervical stenosis Chronic trach vent dependent. -Currently in ICU appreciate CCM management of vent -Continue gabapentin -Continue fentanyl -Continue oxycodone IR   Sacral decubitus ulcer On exam 4 x 4 packing with serosang drainage. -Wound care consulted appreciate recommendations  G-tube dependent Currently n.p.o.  Patient fed through G-tube. -Flush G-tube for and after each use of blockage. -Nutrition consulted.  Anxiety Stable -Continue sertraline 25 mg daily  Constipation Noted soft stool sensing decubitus ulcer.  She is incontinent. -Continue MiraLAX and Dulcolax  Diabetes, type II CBGs overnight range 88-101.  Hemoglobin A1c 5.0 required no insulin coverage overnight -SSI sensitive -Monitor CBGs  FEN/GI:  -NPO -PEG tube Prophylaxis:  -Lovenox 40 daily   Disposition:  SNF when medically stable  Subjective:  No acute events overnight.  Patient denies to yes/no questions.  Denies any chest pain or abdominal pain.  Objective: Pulse Rate:  [44-59] 59 (10/15 2100) Resp:  [12-20] 16 (10/15 2100) BP: (113-150)/(63-75) 114/63 (10/15 2100) SpO2:  [96 %-100 %] 96 % (10/15 2100) FiO2 (%):  [30 %] 30 % (10/15 1930) Physical Exam: General: Pleasant 58 year old female, in no acute distress Cardiovascular: Bradycardic, no murmurs appreciated Respiratory: Sounds clear to auscultation, no crackles, no rhonchi.  Currently trach/vent dependent.  FiO2 30%, PEEP of 5, rate of 12, tidal volume 500 Abdomen: Soft, nontender, nondistended, bowel sounds present Extremities: Very weakness in all extremities.  Able to move fingers and toes on command.  Feet edematous.  Laboratory: Recent Labs  Lab 05/10/19 1600 05/10/19 1610 05/13/19 1131 05/13/19 1538  WBC 8.6  --  6.2 6.3  HGB 11.6* 12.2 12.2 11.5*  HCT 39.9 36.0 40.9 37.0  PLT 294  --  247 267   Recent Labs  Lab 05/10/19 1600 05/10/19 1610 05/13/19 1131 05/13/19 1538  NA 147* 149* 146*  --   K 3.2* 3.3* 3.1*  --   CL 107  --  106  --   CO2 30  --  29  --   BUN 25*  --  14  --   CREATININE 0.39*  --  0.32* 0.42*  CALCIUM 9.3  --  9.3  --   GLUCOSE 181*  --  106*  --     Imaging/Diagnostic Tests: Dg Chest Portable 1 View  Result Date: 05/13/2019 CLINICAL DATA:  Weak  On  ventweakness EXAM: PORTABLE CHEST 1 VIEW COMPARISON:  05/10/2011 FINDINGS: Tracheostomy tube unchanged. Stable cardiac silhouette. Very low lung volumes. Mild basilar atelectasis. No overt pulmonary edema. No pneumothorax. No focal  consolidation. Severe shoulder arthropathy. IMPRESSION: 1. No significant interval change. 2. Very low lung volumes and basilar atelectasis. 3. Tracheostomy tube unchanged. 4. Severe arthropathy of the shoulders. Electronically Signed   By: Suzy Bouchard M.D.   On: 05/13/2019 13:11    Carollee Leitz,  MD 05/13/2019, 9:52 PM [id number], [place] Intern pager: [phone], text pages welcome

## 2019-05-13 NOTE — Consult Note (Signed)
NAME:  Pamela George, MRN:  [id number], DOB:  May 06, 1961, LOS: 0 ADMISSION DATE:  05/13/2019, CONSULTATION DATE: 05/13/2019 REFERRING MD: ED, CHIEF COMPLAINT: Bradycardia  Brief History   58 year old chronic quadriplegic domiciled at [facility] sent for evaluation of bradycardia  History of present illness   Patient is a 58 year old prior history of PEA arrest sent to the emergency room by [facility] valuation of heart rate between 30 and 50.  On my evaluation the patient's heart rate is around 50.  She has copious secretions but is awake and able to interact with minimal head movement.  She does not indicate she has had any chest pain although is intermittently dyspneic nor does she admit to any lightheadedness or dizziness.  Past Medical History   Patient Active Problem List   Diagnosis Date Noted  . Acute on chronic respiratory failure with hypoxia (HCC)   . Quadriplegia and quadriparesis (HCC)   . Obstructive sleep apnea   . Chronic diastolic heart failure (HCC)   . Morbid obesity (HCC)      Significant Hospital Events   NA  Consults:  Family practice  Procedures:  NA  Significant Diagnostic Tests:  NA  Micro Data:  NA  Antimicrobials:  NA  Interim history/subjective:  NA  Objective   Blood pressure 132/71, pulse (!) 52, resp. rate 13, SpO2 100 %.    Vent Mode: PRVC FiO2 (%):  [30 %] 30 % Set Rate:  [12 bmp] 12 bmp Vt Set:  [500 mL] 500 mL PEEP:  [5 cmH20] 5 cmH20 Plateau Pressure:  [20 cmH20] 20 cmH20  No intake or output data in the 24 hours ending 05/13/19 2029 There were no vitals filed for this visit.  Examination: General: chronically trached female on vent with c collar HENT: WNL Lungs: Fairly clear, occasional coarse Cardiovascular: REG Abdomen: Soft benign bowel sounds positive Extremities:trace edema Neuro: Quadraplegia GU:NA  Resolved Hospital Problem list   NA  Assessment & Plan:  1.  Asymptomatic bradycardia: The  previous history of PEA arrest in unclear etiology to her current bradycardia admission for observation is not completely reasonable.  2.  Chronic ventilator support secondary to quadriplegia: We will assist with ventilator management as necessary  3.  Patient currently does not have a fever white count 6 has no indication for antibiotics however I will obtain a tracheal aspirate should she spike a temperature developed pneumonia while in-house  4.  We will assist with critical care issues as necessary.  Best practice:  Diet: NPO Pain/Anxiety/Delirium protocol (if indicated): PRN VAP protocol (if indicated): yes DVT prophylaxis:recommend GI prophylaxis: recommend Glucose control: sliding scale Mobility: bed rest Code Status: full Family Communication: not available Disposition: to ICU  Labs   CBC: Recent Labs  Lab 05/10/19 1600 05/10/19 1610 05/13/19 1131 05/13/19 1538  WBC 8.6  --  6.2 6.3  NEUTROABS 7.4  --   --   --   HGB 11.6* 12.2 12.2 11.5*  HCT 39.9 36.0 40.9 37.0  MCV 93.9  --  92.3 90.9  PLT 294  --  247 267    Basic Metabolic Panel: Recent Labs  Lab 05/10/19 1600 05/10/19 1610 05/13/19 1131 05/13/19 1538  NA 147* 149* 146*  --   K 3.2* 3.3* 3.1*  --   CL 107  --  106  --   CO2 30  --  29  --   GLUCOSE 181*  --  106*  --   BUN 25*  --  14  --   CREATININE 0.39*  --  0.32* 0.42*  CALCIUM 9.3  --  9.3  --    GFR: CrCl cannot be calculated (Unknown ideal weight.). Recent Labs  Lab 05/10/19 1600 05/13/19 1131 05/13/19 1538  WBC 8.6 6.2 6.3    Liver Function Tests: No results for input(s): AST, ALT, ALKPHOS, BILITOT, PROT, ALBUMIN in the last 168 hours. No results for input(s): LIPASE, AMYLASE in the last 168 hours. No results for input(s): AMMONIA in the last 168 hours.  ABG    Component Value Date/Time   PHART 7.445 05/10/2019 1610   PCO2ART 49.2 (H) 05/10/2019 1610   PO2ART 123.0 (H) 05/10/2019 1610   HCO3 33.9 (H) 05/10/2019 1610    TCO2 35 (H) 05/10/2019 1610   O2SAT 99.0 05/10/2019 1610     Coagulation Profile: No results for input(s): INR, PROTIME in the last 168 hours.  Cardiac Enzymes: No results for input(s): CKTOTAL, CKMB, CKMBINDEX, TROPONINI in the last 168 hours.  HbA1C: Hgb A1c MFr Bld  Date/Time Value Ref Range Status  05/13/2019 03:39 PM 5.0 4.8 - 5.6 % Final    Comment:    (NOTE) Pre diabetes:          5.7%-6.4% Diabetes:              >6.4% Glycemic control for   <7.0% adults with diabetes     CBG: Recent Labs  Lab 05/13/19 1807  GLUCAP 95    Review of Systems:   As above  Past Medical History  She,  has a past medical history of Acute on chronic respiratory failure with hypoxia (HCC), Chronic diastolic heart failure (HCC), Morbid obesity (HCC), Obstructive sleep apnea, and Quadriplegia and quadriparesis (HCC).   Surgical History    Past Surgical History:  Procedure Laterality Date  . PEG TUBE PLACEMENT    . TRACHEOSTOMY       Social History   reports that she has never smoked. She has never used smokeless tobacco. She reports previous alcohol use. She reports that she does not use drugs.   Family History   Her family history includes Hypertension in her father and mother.   Allergies Allergies  Allergen Reactions  . Penicillins   . Zosyn [Piperacillin Sod-Tazobactam So]      Home Medications  Prior to Admission medications   Medication Sig Start Date End Date Taking? Authorizing Provider  acetaminophen (TYLENOL) 325 MG tablet Place 650 mg into feeding tube every 6 (six) hours as needed for mild pain.    Yes [provider]  ALPRAZolam (XANAX) 0.25 MG tablet Place 0.25 mg into feeding tube 2 (two) times daily as needed for anxiety.   Yes [provider]  Amino Acids-Protein Hydrolys (FEEDING SUPPLEMENT, PRO-STAT SUGAR FREE 64,) LIQD Place 30 mLs into feeding tube 2 (two) times daily.   Yes [provider]  atorvastatin (LIPITOR) 40 MG tablet  Place 40 mg into feeding tube at bedtime.   Yes [provider]  baclofen (LIORESAL) 10 MG tablet Place 10 mg into feeding tube every 12 (twelve) hours.   Yes [provider]  bisacodyl (DULCOLAX) 10 MG suppository Place 10 mg rectally daily as needed for moderate constipation.   Yes [provider]  bisacodyl (DULCOLAX) 5 MG EC tablet 10 mg 2 (two) times daily. PER TUBE   Yes [provider]  chlorhexidine (PERIDEX) 0.12 % solution Use as directed 5 mLs in the mouth or throat 2 (two) times daily.  Yes [provider]  Cranberry 450 MG CAPS Place 450 mg into feeding tube daily.   Yes [provider]  enoxaparin (LOVENOX) 40 MG/0.4ML injection Inject 40 mg into the skin daily.   Yes [provider]  fentaNYL (DURAGESIC) 75 MCG/HR Place 1 patch onto the skin every 3 (three) days.   Yes [provider]  gabapentin (NEURONTIN) 300 MG capsule Place 300 mg into feeding tube every 8 (eight) hours.   Yes [provider]  insulin lispro (HUMALOG) 100 UNIT/ML injection Inject 2-12 Units into the skin See admin instructions. Four times daily per Sliding Scale: 151-200 2 units 201-250 4 units 251-300 6 units 301-350 8 units 351-400 10 units 401 or greater 12 units & notify MD   Yes [provider]  lactase (LACTAID) 3000 units tablet 3,000 Units daily as needed (STOMACH UPSET). PER TUBE   Yes [provider]  Melatonin 3 MG TABS Place 3 mg into feeding tube at bedtime.   Yes [provider]  nutrition supplement, JUVEN, (JUVEN) PACK Place 1 packet into feeding tube 2 (two) times daily between meals.   Yes [provider]  ondansetron (ZOFRAN) 4 MG tablet Place 4 mg into feeding tube every 6 (six) hours as needed for nausea or vomiting.   Yes [provider]  Oxycodone HCl 10 MG TABS Place 10 mg into feeding tube every 6 (six) hours as needed (PAIN).   Yes [provider]   polyethylene glycol (MIRALAX / GLYCOLAX) 17 g packet Place 17 g into feeding tube daily.   Yes [provider]  sennosides-docusate sodium (SENOKOT-S) 8.6-50 MG tablet Place 1 tablet into feeding tube 2 (two) times daily as needed for constipation.   Yes [provider]  sertraline (ZOLOFT) 25 MG tablet Place 25 mg into feeding tube daily.   Yes [provider]  sodium bicarbonate 650 MG tablet Place 650 mg into feeding tube daily as needed for heartburn.   Yes [provider]     Critical care time: 35 minutes spent in critical care planning, bedside evaluation chart review.

## 2019-05-14 ENCOUNTER — Inpatient Hospital Stay (HOSPITAL_COMMUNITY): Payer: Medicare Other

## 2019-05-14 ENCOUNTER — Encounter (HOSPITAL_COMMUNITY): Payer: Self-pay | Admitting: Family Medicine

## 2019-05-14 DIAGNOSIS — E785 Hyperlipidemia, unspecified: Secondary | ICD-10-CM

## 2019-05-14 DIAGNOSIS — E876 Hypokalemia: Principal | ICD-10-CM

## 2019-05-14 DIAGNOSIS — Z931 Gastrostomy status: Secondary | ICD-10-CM

## 2019-05-14 DIAGNOSIS — E870 Hyperosmolality and hypernatremia: Secondary | ICD-10-CM | POA: Diagnosis present

## 2019-05-14 DIAGNOSIS — Z9911 Dependence on respirator [ventilator] status: Secondary | ICD-10-CM

## 2019-05-14 DIAGNOSIS — Z8674 Personal history of sudden cardiac arrest: Secondary | ICD-10-CM

## 2019-05-14 DIAGNOSIS — I340 Nonrheumatic mitral (valve) insufficiency: Secondary | ICD-10-CM

## 2019-05-14 DIAGNOSIS — R55 Syncope and collapse: Secondary | ICD-10-CM | POA: Diagnosis not present

## 2019-05-14 LAB — LIPID PANEL
Cholesterol: 71 mg/dL (ref 0–200)
HDL: 28 mg/dL — ABNORMAL LOW (ref >40)
LDL Cholesterol: 33 mg/dL (ref 0–99)
Total CHOL/HDL Ratio: 2.5 RATIO
Triglycerides: 49 mg/dL (ref <150)
VLDL: 10 mg/dL (ref 0–40)

## 2019-05-14 LAB — BASIC METABOLIC PANEL
Anion gap: 11 (ref 5–15)
BUN: 14 mg/dL (ref 6–20)
CO2: 27 mmol/L (ref 22–32)
Calcium: 9.3 mg/dL (ref 8.9–10.3)
Chloride: 109 mmol/L (ref 98–111)
Creatinine, Ser: 0.37 mg/dL — ABNORMAL LOW (ref 0.44–1.00)
GFR calc Af Amer: >60 mL/min (ref >60)
GFR calc non Af Amer: >60 mL/min (ref >60)
Glucose, Bld: 101 mg/dL — ABNORMAL HIGH (ref 70–99)
Potassium: 4 mmol/L (ref 3.5–5.1)
Sodium: 147 mmol/L — ABNORMAL HIGH (ref 135–145)

## 2019-05-14 LAB — CBC
HCT: 37.2 % (ref 36.0–46.0)
Hemoglobin: 11.3 g/dL — ABNORMAL LOW (ref 12.0–15.0)
MCH: 27.2 pg (ref 26.0–34.0)
MCHC: 30.4 g/dL (ref 30.0–36.0)
MCV: 89.6 fL (ref 80.0–100.0)
Platelets: 278 10*3/uL (ref 150–400)
RBC: 4.15 MIL/uL (ref 3.87–5.11)
RDW: 15.9 % — ABNORMAL HIGH (ref 11.5–15.5)
WBC: 6.2 10*3/uL (ref 4.0–10.5)
nRBC: 0 % (ref 0.0–0.2)

## 2019-05-14 LAB — GLUCOSE, CAPILLARY
Glucose-Capillary: 114 mg/dL — ABNORMAL HIGH (ref 70–99)
Glucose-Capillary: 88 mg/dL (ref 70–99)
Glucose-Capillary: 88 mg/dL (ref 70–99)
Glucose-Capillary: 96 mg/dL (ref 70–99)
Glucose-Capillary: 97 mg/dL (ref 70–99)

## 2019-05-14 LAB — HEPATIC FUNCTION PANEL
ALT: 14 U/L (ref 0–44)
AST: 14 U/L — ABNORMAL LOW (ref 15–41)
Albumin: 2.6 g/dL — ABNORMAL LOW (ref 3.5–5.0)
Alkaline Phosphatase: 79 U/L (ref 38–126)
Bilirubin, Direct: 0.2 mg/dL (ref 0.0–0.2)
Indirect Bilirubin: 1 mg/dL — ABNORMAL HIGH (ref 0.3–0.9)
Total Bilirubin: 1.2 mg/dL (ref 0.3–1.2)
Total Protein: 6.2 g/dL — ABNORMAL LOW (ref 6.5–8.1)

## 2019-05-14 LAB — ECHOCARDIOGRAM COMPLETE

## 2019-05-14 LAB — MRSA PCR SCREENING: MRSA by PCR: NEGATIVE

## 2019-05-14 MED ORDER — CHLORHEXIDINE GLUCONATE 0.12 % MT SOLN
OROMUCOSAL | Status: AC
  Filled 2019-05-14: qty 15

## 2019-05-14 MED ORDER — MAGNESIUM SULFATE 2 GM/50ML IV SOLN
2.0000 g | Freq: Once | INTRAVENOUS | Status: AC
  Administered 2019-05-14: 2 g via INTRAVENOUS
  Filled 2019-05-14: qty 50

## 2019-05-14 MED ORDER — OSMOLITE 1.5 CAL PO LIQD
1000.0000 mL | ORAL | Status: DC
  Filled 2019-05-14: qty 1000

## 2019-05-14 MED ORDER — POTASSIUM CHLORIDE 20 MEQ/15ML (10%) PO SOLN
30.0000 meq | ORAL | Status: AC
  Administered 2019-05-14 (×2): 30 meq via ORAL
  Filled 2019-05-14 (×2): qty 30

## 2019-05-14 MED ORDER — CHLORHEXIDINE GLUCONATE 0.12% ORAL RINSE (MEDLINE KIT)
15.0000 mL | Freq: Two times a day (BID) | OROMUCOSAL | Status: DC
  Administered 2019-05-14 (×3): 15 mL via OROMUCOSAL

## 2019-05-14 MED ORDER — POTASSIUM CHLORIDE 20 MEQ/15ML (10%) PO SOLN: 40.0000 meq | Freq: Every day | ORAL | 0 refills | Status: AC

## 2019-05-14 MED ORDER — FREE WATER: 200.0000 mL | Freq: Four times a day (QID) | 0 refills | Status: AC

## 2019-05-14 MED ORDER — MAGNESIUM SULFATE 2 GM/50ML IV SOLN
2.0000 g | Freq: Every day | INTRAVENOUS | Status: DC
  Administered 2019-05-14: 2 g via INTRAVENOUS
  Filled 2019-05-14: qty 50

## 2019-05-14 MED ORDER — ORAL CARE MOUTH RINSE
15.0000 mL | OROMUCOSAL | Status: DC
  Administered 2019-05-14 (×8): 15 mL via OROMUCOSAL

## 2019-05-14 MED ORDER — PRO-STAT SUGAR FREE PO LIQD
30.0000 mL | Freq: Three times a day (TID) | ORAL | Status: DC
  Administered 2019-05-14 (×2): 30 mL
  Filled 2019-05-14: qty 30

## 2019-05-14 MED ORDER — CHLORHEXIDINE GLUCONATE CLOTH 2 % EX PADS: 6.0000 | MEDICATED_PAD | Freq: Every day | CUTANEOUS | Status: DC

## 2019-05-14 MED ORDER — POTASSIUM CHLORIDE CRYS ER 20 MEQ PO TBCR: 30.0000 meq | EXTENDED_RELEASE_TABLET | ORAL | Status: DC

## 2019-05-14 NOTE — Progress Notes (Signed)
[place] Progress Note Patient Name: Pamela George DOB: [birth year] MRN: [id number]   Date of Service  05/14/2019  HPI/Events of Note  RN requests changing KCL order from pills to elixir.  eICU Interventions  KCL tabs discontinued and KCL elixir substituted.        Kerry Kass Ogan 05/14/2019, 12:50 AM

## 2019-05-14 NOTE — Plan of Care (Deleted)
  Problem: Education: Goal: Knowledge of General Education information will improve Description: Including pain rating scale, medication(s)/side effects and non-pharmacologic comfort measures Outcome: Progressing   Problem: Health Behavior/Discharge Planning: Goal: Ability to manage health-related needs will improve Outcome: Progressing   Problem: Clinical Measurements: Goal: Ability to maintain clinical measurements within normal limits will improve Outcome: Progressing Goal: Will remain free from infection Outcome: Progressing Goal: Respiratory complications will improve Outcome: Progressing Goal: Cardiovascular complication will be avoided Outcome: Progressing   Problem: Activity: Goal: Risk for activity intolerance will decrease Outcome: Progressing   Problem: Nutrition: Goal: Adequate nutrition will be maintained Outcome: Progressing   Problem: Coping: Goal: Level of anxiety will decrease Outcome: Progressing   Problem: Elimination: Goal: Will not experience complications related to urinary retention Outcome: Progressing   Problem: Pain Managment: Goal: General experience of comfort will improve Outcome: Progressing   Problem: Safety: Goal: Ability to remain free from injury will improve Outcome: Progressing   Problem: Skin Integrity: Goal: Risk for impaired skin integrity will decrease Outcome: Progressing   Problem: Education: Goal: Knowledge of the prescribed therapeutic regimen will improve Outcome: Progressing   Problem: Cardiac: Goal: Will achieve and/or maintain hemodynamic stability Outcome: Progressing   Problem: Clinical Measurements: Goal: Postoperative complications will be avoided or minimized Outcome: Progressing   Problem: Urinary Elimination: Goal: Ability to achieve and maintain adequate renal perfusion and functioning will improve Outcome: Progressing

## 2019-05-14 NOTE — Discharge Summary (Signed)
Family Medicine Teaching [facility] Discharge Summary  Patient name: Pamela George Medical record number: [id number] Date of birth: 05-27-1961 Age: 58 y.o. Gender: female Date of Admission: 05/13/2019  Date of Discharge: 05/14/2019 Admitting Physician: Mirian Mo, MD  Primary Care Provider: Zollie Scale, MD Consultants: Cardiology  Indication for Hospitalization: Bradycardia  Discharge Diagnoses/Problem List:  Ventilator dependent Quadriplegia Sacral decubitus ulcer G-tube dependent Anxiety Constipation Type 2 diabetes  Disposition: back to [facility]  Discharge Condition: improved, stable  Discharge Exam:  General: Pleasant 58 year old female, in no acute distress Cardiovascular: Bradycardic, no murmurs appreciated Respiratory: Sounds clear to auscultation, no crackles, no rhonchi.  Currently trach/vent dependent.  FiO2 30%, PEEP of 5, rate of 12, tidal volume 500 Abdomen: Soft, nontender, nondistended, bowel sounds present Extremities: Very weakness in all extremities.  Able to move fingers and toes on command.  Feet edematous. Exam performed by Dr. Clent Ridges on day of discharge.  Brief Hospital Course:  Patient was admitted with asymptomatic bradycardia with an unclear etiology, referred by SNF.  Cardiology was consulted, and thought bradycardia was likely due to electrolyte abnormalities as she was slightly hypokalemic and hypomagnesemic on admission. She was repleted with oral potassium and magnesium. Cardiology did not see need for pacer.  CCM was consulted to manage patient's ventilator during her hospitalization without notable events. She was monitored overnight on tele monitoring without notable events, heart rate at discharge was in 70s. Nutrition was consulted who did not make any updated recommendations regarding her diet. She will be discharged with standing potassium to keep potassium >4 and magnesium >2.  Issues for Follow Up:  1. Please follow up potassium  and magnesium levels to ensure K>4 and Mg >2. 2. Nutrition recommendations as below: Tube feeds: - Osmolite 1.5 @ 40 ml/hr (960 ml/day) - Pro-stat 30 ml TID - Continue free water flushes of 200 ml QID  Significant Procedures: none  Significant Labs and Imaging:  Recent Labs  Lab 05/13/19 1131 05/13/19 1538 05/14/19 0253  WBC 6.2 6.3 6.2  HGB 12.2 11.5* 11.3*  HCT 40.9 37.0 37.2  PLT 247 267 278   Recent Labs  Lab 05/10/19 1600 05/10/19 1610 05/13/19 1131 05/13/19 1538 05/14/19 0253  NA 147* 149* 146*  --  147*  K 3.2* 3.3* 3.1*  --  4.0  CL 107  --  106  --  109  CO2 30  --  29  --  27  GLUCOSE 181*  --  106*  --  101*  BUN 25*  --  14  --  14  CREATININE 0.39*  --  0.32* 0.42* 0.37*  CALCIUM 9.3  --  9.3  --  9.3  MG  --   --   --  1.5*  --     Dg Chest Portable 1 View  Result Date: 05/13/2019 CLINICAL DATA:  Weak  On  ventweakness EXAM: PORTABLE CHEST 1 VIEW COMPARISON:  05/10/2011 FINDINGS: Tracheostomy tube unchanged. Stable cardiac silhouette. Very low lung volumes. Mild basilar atelectasis. No overt pulmonary edema. No pneumothorax. No focal consolidation. Severe shoulder arthropathy. IMPRESSION: 1. No significant interval change. 2. Very low lung volumes and basilar atelectasis. 3. Tracheostomy tube unchanged. 4. Severe arthropathy of the shoulders. Electronically Signed   By: Genevive Bi M.D.   On: 05/13/2019 13:11    Results/Tests Pending at Time of Discharge: none  Discharge Medications:  Allergies as of 05/14/2019      Reactions   Penicillins    Zosyn [piperacillin Sod-tazobactam  So]       Medication List    TAKE these medications   acetaminophen 325 MG tablet Commonly known as: TYLENOL Place 650 mg into feeding tube every 6 (six) hours as needed for mild pain.   ALPRAZolam 0.25 MG tablet Commonly known as: XANAX Place 0.25 mg into feeding tube 2 (two) times daily as needed for anxiety.   atorvastatin 40 MG tablet Commonly known as:  LIPITOR Place 40 mg into feeding tube at bedtime.   baclofen 10 MG tablet Commonly known as: LIORESAL Place 10 mg into feeding tube every 12 (twelve) hours.   bisacodyl 5 MG EC tablet Commonly known as: DULCOLAX 10 mg 2 (two) times daily. PER TUBE   bisacodyl 10 MG suppository Commonly known as: DULCOLAX Place 10 mg rectally daily as needed for moderate constipation.   chlorhexidine 0.12 % solution Commonly known as: PERIDEX Use as directed 5 mLs in the mouth or throat 2 (two) times daily.   Cranberry 450 MG Caps Place 450 mg into feeding tube daily.   enoxaparin 40 MG/0.4ML injection Commonly known as: LOVENOX Inject 40 mg into the skin daily.   feeding supplement (PRO-STAT SUGAR FREE 64) Liqd Place 30 mLs into feeding tube 2 (two) times daily.   fentaNYL 75 MCG/HR Commonly known as: DURAGESIC Place 1 patch onto the skin every 3 (three) days.   free water Soln Place 200 mLs into feeding tube every 6 (six) hours.   gabapentin 300 MG capsule Commonly known as: NEURONTIN Place 300 mg into feeding tube every 8 (eight) hours.   insulin lispro 100 UNIT/ML injection Commonly known as: HUMALOG Inject 2-12 Units into the skin See admin instructions. Four times daily per Sliding Scale: 151-200 2 units 201-250 4 units 251-300 6 units 301-350 8 units 351-400 10 units 401 or greater 12 units & notify MD   lactase 3000 units tablet Commonly known as: LACTAID 3,000 Units daily as needed (STOMACH UPSET). PER TUBE   Melatonin 3 MG Tabs Place 3 mg into feeding tube at bedtime.   nutrition supplement (JUVEN) Pack Place 1 packet into feeding tube 2 (two) times daily between meals.   ondansetron 4 MG tablet Commonly known as: ZOFRAN Place 4 mg into feeding tube every 6 (six) hours as needed for nausea or vomiting.   Oxycodone HCl 10 MG Tabs Place 10 mg into feeding tube every 6 (six) hours as needed (PAIN).   polyethylene glycol 17 g packet Commonly known as: MIRALAX /  GLYCOLAX Place 17 g into feeding tube daily.   potassium chloride 20 MEQ/15ML (10%) Soln Place 30 mLs (40 mEq total) into feeding tube daily.   sennosides-docusate sodium 8.6-50 MG tablet Commonly known as: SENOKOT-S Place 1 tablet into feeding tube 2 (two) times daily as needed for constipation.   sertraline 25 MG tablet Commonly known as: ZOLOFT Place 25 mg into feeding tube daily.   sodium bicarbonate 650 MG tablet Place 650 mg into feeding tube daily as needed for heartburn.       Discharge Instructions: Please refer to Patient Instructions section of EMR for full details.  Patient was counseled important signs and symptoms that should prompt return to medical care, changes in medications, dietary instructions, activity restrictions, and follow up appointments.   Follow-Up Appointments: Contact information for after-discharge care    Destination    [facility] .   Service: Skilled Nursing Contact information: [address] [place] [id number] [phone]  Rory Percy, DO 05/14/2019, 3:45 PM PGY-3, [place]

## 2019-05-14 NOTE — NC FL2 (Signed)
[place] LEVEL OF CARE SCREENING TOOL     IDENTIFICATION  Patient Name: Pamela George Birthdate: 23-Aug-1960 Sex: female Admission Date (Current Location): 05/13/2019  [facility] and [place] Number:  Herbalist and Address:  [facility] [place], [place] [address] [place], [place] [address]      Provider Number: [id number]  Attending Physician Name and Address:  McDiarmid, Blane Ohara, MD  Relative Name and Phone Number:       Current Level of Care: Hospital Recommended Level of Care: [place] Prior Approval Number:    Date Approved/Denied:   PASRR Number:    Discharge Plan: SNF    Current Diagnoses: Patient Active Problem List   Diagnosis Date Noted  . Hypokalemia 05/14/2019  . Hypernatremia 05/14/2019  . History of cardiac arrest 05/14/2019  . Status post insertion of percutaneous endoscopic gastrostomy (PEG) tube ([place]) 05/14/2019  . Hyperlipidemia   . Hypomagnesemia   . Bradycardia with 31-40 beats per minute, narrow-complex 05/13/2019  . Ventilator dependent ([place])   . Acute on chronic respiratory failure with hypoxia ([place])   . Quadriplegia and quadriparesis ([place])   . Obstructive sleep apnea   . History of diastolic dysfunction     Orientation RESPIRATION BLADDER Height & Weight     (unable to assess- pt on trach)  Tracheostomy(28%) Incontinent, External catheter(10/15) Weight: 167 lb 5.3 oz (75.9 kg) Height:     BEHAVIORAL SYMPTOMS/MOOD NEUROLOGICAL BOWEL NUTRITION STATUS      Incontinent Diet, Feeding tube(PEG)  AMBULATORY STATUS COMMUNICATION OF NEEDS Skin   Extensive Assist   Skin abrasions(Pressure injury on buttocks, moist to dry dressing)                       Personal Care Assistance Level of Assistance  Bathing, Feeding, Dressing Bathing Assistance: Maximum assistance Feeding assistance: Maximum assistance Dressing Assistance: Maximum assistance     Functional Limitations Info  Sight,  Speech, Hearing Sight Info: Adequate Hearing Info: Adequate Speech Info: Impaired(pt has trach)    SPECIAL CARE FACTORS FREQUENCY  PT (By licensed PT), OT (By licensed OT)     PT Frequency: 5x OT Frequency: 5x            Contractures Contractures Info: Not present    Additional Factors Info  Code Status, Allergies Code Status Info: Full Code Allergies Info: Penicillins, Zosyn (Piperacillin Sod-tazobactam So)           Current Medications (05/14/2019):  This is the current hospital active medication list Current Facility-Administered Medications  Medication Dose Route Frequency Provider Last Rate Last Dose  . acetaminophen (TYLENOL) tablet 650 mg  650 mg Oral Q6H PRN Matilde Haymaker, MD       Or  . acetaminophen (TYLENOL) suppository 650 mg  650 mg Rectal Q6H PRN Matilde Haymaker, MD      . ALPRAZolam Duanne Moron) tablet 0.25 mg  0.25 mg Per Tube BID PRN Matilde Haymaker, MD      . ammonium lactate (LAC-HYDRIN) 12 % lotion   Topical PRN Matilde Haymaker, MD      . atorvastatin (LIPITOR) tablet 40 mg  40 mg Per Tube q1800 Matilde Haymaker, MD   40 mg at 05/13/19 1825  . baclofen (LIORESAL) tablet 10 mg  10 mg Per Tube BID Matilde Haymaker, MD   10 mg at 05/14/19 1043  . bisacodyl (DULCOLAX) EC tablet 10 mg  10 mg Oral BID Matilde Haymaker, MD      .  bisacodyl (DULCOLAX) suppository 10 mg  10 mg Rectal Daily PRN Matilde Haymaker, MD      . chlorhexidine gluconate (MEDLINE KIT) (PERIDEX) 0.12 % solution 15 mL  15 mL Mouth Rinse BID McDiarmid, Blane Ohara, MD   15 mL at 05/14/19 0846  . Chlorhexidine Gluconate Cloth 2 % PADS 6 each  6 each Topical Daily McDiarmid, Blane Ohara, MD      . enoxaparin (LOVENOX) injection 40 mg  40 mg Subcutaneous Q24H Matilde Haymaker, MD   40 mg at 05/13/19 1827  . feeding supplement (OSMOLITE 1.5 CAL) liquid 1,000 mL  1,000 mL Per Tube Continuous McDiarmid, Blane Ohara, MD      . feeding supplement (PRO-STAT SUGAR FREE 64) liquid 30 mL  30 mL Per Tube TID McDiarmid, Blane Ohara, MD      . fentaNYL  (DURAGESIC) 75 MCG/HR 1 patch  1 patch Transdermal Q72H Matilde Haymaker, MD   1 patch at 05/14/19 1048  . free water 200 mL  200 mL Per Tube Q6H Matilde Haymaker, MD   200 mL at 05/14/19 0849  . gabapentin (NEURONTIN) 250 MG/5ML solution 300 mg  300 mg Per Tube TID Matilde Haymaker, MD   300 mg at 05/14/19 1049  . insulin aspart (novoLOG) injection 0-9 Units  0-9 Units Subcutaneous TID WC Matilde Haymaker, MD      . lactase (LACTAID) tablet 6,000 Units  6,000 Units Oral Daily PRN Matilde Haymaker, MD      . lidocaine (LIDODERM) 5 % 1 patch  1 patch Transdermal Q24H Matilde Haymaker, MD   1 patch at 05/14/19 0130  . magnesium sulfate IVPB 2 g 50 mL  2 g Intravenous Daily McDiarmid, Blane Ohara, MD      . MEDLINE mouth rinse  15 mL Mouth Rinse 10 times per day McDiarmid, Blane Ohara, MD   15 mL at 05/14/19 0608  . Melatonin TABS 3 mg  3 mg Gastric Tube QHS McDiarmid, Blane Ohara, MD   3 mg at 05/14/19 0029  . nutrition supplement (JUVEN) (JUVEN) powder packet 1 packet  1 packet Oral BID BM Matilde Haymaker, MD   1 packet at 05/14/19 1043  . oxyCODONE (Oxy IR/ROXICODONE) immediate release tablet 10 mg  10 mg Per Tube Q6H Matilde Haymaker, MD   10 mg at 05/14/19 0848  . polyethylene glycol (MIRALAX / GLYCOLAX) packet 17 g  17 g Per NG tube Daily PRN Matilde Haymaker, MD      . sertraline (ZOLOFT) tablet 25 mg  25 mg Per Tube Daily Matilde Haymaker, MD   25 mg at 05/14/19 1044  . sodium bicarbonate tablet 650 mg  650 mg Per Tube Daily Matilde Haymaker, MD   650 mg at 05/14/19 1043     Discharge Medications: Please see discharge summary for a list of discharge medications.  Relevant Imaging Results:  Relevant Lab Results:   Additional Information SSN;[id number]  Eileen Stanford, LCSW

## 2019-05-14 NOTE — Plan of Care (Signed)
  Problem: Education: Goal: Knowledge of General Education information will improve Description: Including pain rating scale, medication(s)/side effects and non-pharmacologic comfort measures 05/14/2019 0207 by Orpah Greek, RN Outcome: Not Progressing 05/14/2019 0205 by Orpah Greek, RN Outcome: Progressing   Problem: Health Behavior/Discharge Planning: Goal: Ability to manage health-related needs will improve 05/14/2019 0207 by Orpah Greek, RN Outcome: Not Progressing 05/14/2019 0205 by Orpah Greek, RN Outcome: Progressing   Problem: Clinical Measurements: Goal: Ability to maintain clinical measurements within normal limits will improve 05/14/2019 0207 by Orpah Greek, RN Outcome: Progressing 05/14/2019 0205 by Orpah Greek, RN Outcome: Progressing Goal: Will remain free from infection Outcome: Progressing Goal: Respiratory complications will improve 05/14/2019 0207 by Orpah Greek, RN Outcome: Progressing 05/14/2019 0205 by Orpah Greek, RN Outcome: Progressing Goal: Cardiovascular complication will be avoided 05/14/2019 0207 by Orpah Greek, RN Outcome: Progressing 05/14/2019 0205 by Orpah Greek, RN Outcome: Progressing   Problem: Activity: Goal: Risk for activity intolerance will decrease 05/14/2019 0207 by Orpah Greek, RN Outcome: Not Progressing 05/14/2019 0205 by Orpah Greek, RN Outcome: Progressing   Problem: Nutrition: Goal: Adequate nutrition will be maintained 05/14/2019 0207 by Orpah Greek, RN Outcome: Not Progressing 05/14/2019 0205 by Orpah Greek, RN Outcome: Progressing   Problem: Coping: Goal: Level of anxiety will decrease 05/14/2019 0207 by Orpah Greek, RN Outcome: Progressing 05/14/2019 0205 by Orpah Greek, RN Outcome: Progressing   Problem: Elimination: Goal: Will not experience complications related to bowel  motility Outcome: Progressing Goal: Will not experience complications related to urinary retention Outcome: Progressing   Problem: Pain Managment: Goal: General experience of comfort will improve 05/14/2019 0207 by Orpah Greek, RN Outcome: Progressing 05/14/2019 0205 by Orpah Greek, RN Outcome: Progressing   Problem: Safety: Goal: Ability to remain free from injury will improve 05/14/2019 0207 by Orpah Greek, RN Outcome: Progressing 05/14/2019 0205 by Orpah Greek, RN Outcome: Progressing   Problem: Skin Integrity: Goal: Risk for impaired skin integrity will decrease 05/14/2019 0207 by Orpah Greek, RN Outcome: Not Progressing 05/14/2019 0205 by Orpah Greek, RN Outcome: Progressing

## 2019-05-14 NOTE — Consult Note (Signed)
[name] Nurse wound consult note Reason for Consult: Stage 4 pressure injury, chronic, nonhealing Wound type: Pressure Pressure Injury POA: Yes Measurement: 7.4cm x 4.2cm x 3.2cm.  2.2cm undermining at 12 o'clock Wound bed:100% red, moist Drainage (amount, consistency, odor) moderate amount of serous exudate on old dressing Periwound: intact Dressing procedure/placement/frequency: I will implement twice daily wound care with NS dampened roll gauze, covered with dry gauze 4x4s, ABD and secured with 2-inch paper tape.  Turning and repositioning is in place. Bilateral hips are intact. I will provide bilateral pressure redistribution heel boots (heels are intact).   [name] nursing team will not follow, but will remain available to this patient, the nursing and medical teams.  Please re-consult if needed.  Thanks, Maudie Flakes, MSN, RN, [place], Arther Abbott  Pager# [phone]

## 2019-05-14 NOTE — Progress Notes (Signed)
Pulmonary Note S: Appears comfortable on vent Denies complaints  O: Mild bradycardia Triggering vent Lungs clear Quadriplegic  A: Vent dependence Asymptomatic bradycardia requiring transfer from [facility] for unclear reason  P:  Continue vent Try to get PMV so patient can talk Will see again Monday if patient still here Remainder of care per primary  Erskine Emery MD PCCM

## 2019-05-14 NOTE — TOC Transition Note (Signed)
Transition of Care [facility]) - CM/SW Discharge Note   Patient Details  Name: Pamela George MRN: [id number] Date of Birth: Apr 12, 1961  [facility]) CM/SW Contact:  Eileen Stanford, LCSW Phone Number: 05/14/2019, 4:01 PM   Clinical Narrative:   Clinical Social Worker facilitated patient discharge including contacting patient family and facility to confirm patient discharge plans.  Clinical information faxed to facility and family agreeable with plan.  CSW arranged ambulance transport via [place] (6:00PM) to [facility] .  RN to call [phone]) for report prior to discharge.  IF PT IS NOT READY BY 6 RN PLEASE CALL PTAR [phone], TO RESCHEDULE     Final next level of care: [place] Barriers to Discharge: No Barriers Identified   Patient Goals and CMS Choice        Discharge Placement              Patient chooses bed at: [facility] Patient to be transferred to facility by: [place] Name of family member notified: [place] Patient and family notified of of transfer: 05/14/19  Discharge Plan and Services In-house Referral: Clinical Social Work   Post Acute Care Choice: [place]                               Social Determinants of Health (SDOH) Interventions     Readmission Risk Interventions No flowsheet data found.

## 2019-05-14 NOTE — TOC Initial Note (Addendum)
Transition of Care [facility]) - Initial/Assessment Note    Patient Details  Name: Pamela George MRN: [id number] Date of Birth: 1961-03-31  [facility] of [facility]) CM/SW Contact:    Eileen Stanford, LCSW Phone Number: 05/14/2019, 1:04 PM  Clinical Narrative:  Pt is on trach at 28%. Pt came from [facility]. CSW attempted to reach pt's Sister, Gretta Began, CSW left voicemail. CSW spoke with RN at SNF and they confirmed pt is from [facility] and can return.      CSW spoke with pt's sister and she is on board with pt returning to [facility] today.        Expected Discharge Plan: [place] Barriers to Discharge: Continued Medical Work up   Patient Goals and CMS Choice        Expected Discharge Plan and Services Expected Discharge Plan: [place] In-house Referral: Clinical Social Work   Post Acute Care Choice: [place] Living arrangements for the past 2 months: [place]                                      Prior Living Arrangements/Services Living arrangements for the past 2 months: [place] Lives with:: Facility Resident Patient language and need for interpreter reviewed:: Yes Do you feel safe going back to the place where you live?: Yes      Need for Family Participation in Patient Care: Yes (Comment) Care giver support system in place?: Yes (comment)   Criminal Activity/Legal Involvement Pertinent to Current Situation/Hospitalization: No - Comment as needed  Activities of Daily Living      Permission Sought/Granted Permission sought to share information with : Family Supports, Chartered certified accountant granted to share information with : No  Share Information with NAME: Verdene Lennert  Permission granted to share info w AGENCY: [facility]  Permission granted to share info w Relationship: daughter  Permission granted to share info w Contact Information:  [phone]  Emotional Assessment Appearance:: Appears stated age Attitude/Demeanor/Rapport: Unable to Assess Affect (typically observed): Unable to Assess Orientation: : (unable to assess -pt has trach) Alcohol / Substance Use: Not Applicable Psych Involvement: No (comment)  Admission diagnosis:  Bradycardia [R00.1] Ventilator dependent ([place]) [Z99.11] Patient Active Problem List   Diagnosis Date Noted  . Hypokalemia 05/14/2019  . Hypernatremia 05/14/2019  . History of cardiac arrest 05/14/2019  . Status post insertion of percutaneous endoscopic gastrostomy (PEG) tube ([place]) 05/14/2019  . Hyperlipidemia   . Hypomagnesemia   . Bradycardia with 31-40 beats per minute, narrow-complex 05/13/2019  . Ventilator dependent ([place])   . Acute on chronic respiratory failure with hypoxia ([place])   . Quadriplegia and quadriparesis ([place])   . Obstructive sleep apnea   . History of diastolic dysfunction    PCP:  Ellin Saba, MD Pharmacy:  No Pharmacies Listed    Social Determinants of Health (SDOH) Interventions    Readmission Risk Interventions No flowsheet data found.

## 2019-05-14 NOTE — Progress Notes (Signed)
Progress Note  Patient Name: Pamela George Date of Encounter: 05/14/2019  Primary Cardiologist:   Subjective   No chest pain; Echo being done  Inpatient Medications    Scheduled Meds: . atorvastatin  40 mg Per Tube q1800  . baclofen  10 mg Per Tube BID  . bisacodyl  10 mg Oral BID  . chlorhexidine gluconate (MEDLINE KIT)  15 mL Mouth Rinse BID  . Chlorhexidine Gluconate Cloth  6 each Topical Daily  . enoxaparin (LOVENOX) injection  40 mg Subcutaneous Q24H  . fentaNYL  1 patch Transdermal Q72H  . free water  200 mL Per Tube Q6H  . gabapentin  300 mg Per Tube TID  . insulin aspart  0-9 Units Subcutaneous TID WC  . lidocaine  1 patch Transdermal Q24H  . mouth rinse  15 mL Mouth Rinse 10 times per day  . Melatonin  3 mg Gastric Tube QHS  . nutrition supplement (JUVEN)  1 packet Oral BID BM  . oxyCODONE  10 mg Per Tube Q6H  . sertraline  25 mg Per Tube Daily  . sodium bicarbonate  650 mg Per Tube Daily   Continuous Infusions: . magnesium sulfate bolus IVPB 2 g (05/14/19 0839)  . magnesium sulfate bolus IVPB     PRN Meds: acetaminophen **OR** acetaminophen, ALPRAZolam, ammonium lactate, bisacodyl, lactase, polyethylene glycol   Vital Signs    Vitals:   05/14/19 0600 05/14/19 0700 05/14/19 0734 05/14/19 0753  BP: (!) 111/54 (!) 118/48    Pulse: (!) 46 (!) 46    Resp: 12 12    Temp:    98.2 F (36.8 C)  TempSrc:    Oral  SpO2: 99% 98% 98%     Intake/Output Summary (Last 24 hours) at 05/14/2019 0932 Last data filed at 05/14/2019 0600 Gross per 24 hour  Intake 60 ml  Output -  Net 60 ml    I/O since admission: +60     Telemetry    Sinus - Personally Reviewed  ECG    05/13/2019 ECG (independently read by me): Sinus bradycardia at 46   Physical Exam    BP (!) 118/48   Pulse (!) 46   Temp 98.2 F (36.8 C) (Oral)   Resp 12   SpO2 98%  General: no distress.  Skin: normal turgor, no rashes, warm and dry HEENT: Normocephalic, atraumatic. Pupils  equal round and reactive to light; sclera anicteric; extraocular muscles intact; Nose without nasal septal hypertrophy Mouth/Parynx benign; Neck: Neck collar in place, trach on vent Lungs: clear to ausculatation and percussion; no wheezing or rales Chest wall: without tenderness to palpitation Heart: PMI not displaced, RRR, s1 s2 normal, 1/6 systolic murmur, no diastolic murmur, no rubs, gallops, thrills, or heaves Abdomen: soft, nontender; no hepatosplenomehaly, BS+; abdominal aorta nontender and not dilated by palpation. Back: no CVA tenderness Pulses 2+ Musculoskeletal: full range of motion, normal strength, no joint deformities Extremities: no clubbing cyanosis or edema, Homan's sign negative  Neurologic: flaccid, quadreplegic Psychologic: Normal mood and affect   Labs    Chemistry Recent Labs  Lab 05/10/19 1600 05/10/19 1610 05/13/19 1131 05/13/19 1538 05/14/19 0253  NA 147* 149* 146*  --  147*  K 3.2* 3.3* 3.1*  --  4.0  CL 107  --  106  --  109  CO2 30  --  29  --  27  GLUCOSE 181*  --  106*  --  101*  BUN 25*  --  14  --  14  CREATININE 0.39*  --  0.32* 0.42* 0.37*  CALCIUM 9.3  --  9.3  --  9.3  GFRNONAA >60  --  >60 >60 >60  GFRAA >60  --  >60 >60 >60  ANIONGAP 10  --  11  --  11     Hematology Recent Labs  Lab 05/13/19 1131 05/13/19 1538 05/14/19 0253  WBC 6.2 6.3 6.2  RBC 4.43 4.07 4.15  HGB 12.2 11.5* 11.3*  HCT 40.9 37.0 37.2  MCV 92.3 90.9 89.6  MCH 27.5 28.3 27.2  MCHC 29.8* 31.1 30.4  RDW 15.7* 15.8* 15.9*  PLT 247 267 278   Lipid Panel  No results found for: CHOL, TRIG, HDL, CHOLHDL, VLDL, LDLCALC, LDLDIRECT, LABVLDL   Cardiac EnzymesNo results for input(s): TROPONINI in the last 168 hours. No results for input(s): TROPIPOC in the last 168 hours.   BNPNo results for input(s): BNP, PROBNP in the last 168 hours.   DDimer No results for input(s): DDIMER in the last 168 hours.   Lipid Panel  No results found for: CHOL, TRIG, HDL,  CHOLHDL, VLDL, LDLCALC, LDLDIRECT   Radiology    Dg Chest Portable 1 View  Result Date: 05/13/2019 CLINICAL DATA:  Weak  On  ventweakness EXAM: PORTABLE CHEST 1 VIEW COMPARISON:  05/10/2011 FINDINGS: Tracheostomy tube unchanged. Stable cardiac silhouette. Very low lung volumes. Mild basilar atelectasis. No overt pulmonary edema. No pneumothorax. No focal consolidation. Severe shoulder arthropathy. IMPRESSION: 1. No significant interval change. 2. Very low lung volumes and basilar atelectasis. 3. Tracheostomy tube unchanged. 4. Severe arthropathy of the shoulders. Electronically Signed   By: Suzy Bouchard M.D.   On: 05/13/2019 13:11    Cardiac Studies   ECHO is currently being done at the bedside.  A preliminary review by me reveals fairly well-preserved heart function with normal EF and no significant valvular abnormalities.  Patient Profile     Pamela George is a 58 y.o. female with a hx of quadriplegia with respiratory failure- trach and vent dependent, spinal stenosis, DM-2, GERD, HTN, lymphedema, diastolic HF, OSA with obesity hypoventilation sent to [facility] from [facility] due to bradycardia, in the 30s, this occurred in Sept 2020 and 05/10/19 with episode of being unresponsive  on a Tbar and hypoxic who was seen for the evaluation of bradycardia at the request of Dr. McDiarmid.   Assessment & Plan    1.  Sinus bradycardia: Currently, telemetry reveals sinus bradycardia in the 50s.  Asymptomatic. She is not on any negative chronotropic medications.  Systolic prior ER evaluations in the setting of hypoxia, bradycardia with mucous plugging which improved following suctioning.  No signs of advanced heart block.  2.  Electrolyte abnormality: Potassium 3.1, repleted to 4.0.  Magnesium 1.5, currently undergoing repletion with a total of 4 g of magnesium sulfate.  Suspect electrolyte abnormalities may have contributed to her arrhythmia.  TSH 740.874.  3.  Hyperlipidemia: Currently on  atorvastatin 40 mg daily.  Uncertain what current LDL is since this has not been done.  If still fasting, can check today with LFTs to make sure therapy is appropriate and that she is tolerating treatment.  Will sign off.  Call Call if needed.  She is on atorvastatin  Signed, Troy Sine, MD, [facility] 05/14/2019, 9:32 AM

## 2019-05-14 NOTE — Progress Notes (Signed)
Magnesium is low will give 2 Gm IV

## 2019-05-14 NOTE — Progress Notes (Signed)
  Echocardiogram 2D Echocardiogram has been performed.  Pamela George 05/14/2019, 10:19 AM

## 2019-05-14 NOTE — Plan of Care (Signed)
  Problem: Clinical Measurements: Goal: Ability to maintain clinical measurements within normal limits will improve Outcome: Progressing Goal: Cardiovascular complication will be avoided Outcome: Progressing   Problem: Coping: Goal: Level of anxiety will decrease Outcome: Progressing   Problem: Pain Managment: Goal: General experience of comfort will improve Outcome: Progressing   

## 2019-05-14 NOTE — Progress Notes (Signed)
Initial Nutrition Assessment  DOCUMENTATION CODES:   Not applicable  INTERVENTION:   Tube feeds: - Osmolite 1.5 @ 40 ml/hr (960 ml/day) - Pro-stat 30 ml TID - Continue free water flushes of 200 ml QID  Tube feeding regimen and free water provides 1740 kcal, 105 grams of protein, and 1531 ml of H2O.  - 1 packet Juven BID per tube, each packet provides 95 calories, 2.5 grams of protein, and 9.8 grams of carbohydrate; also contains L-arginine and L-glutamine, vitamin C, vitamin E, vitamin B-12, zinc, calcium, and calcium Beta-hydroxy-Beta-methylbutyrate to support wound healing  NUTRITION DIAGNOSIS:   Inadequate oral intake related to inability to eat as evidenced by NPO status.  GOAL:   Patient will meet greater than or equal to 90% of their needs  MONITOR:   Vent status, Labs, Weight trends, TF tolerance, Skin, I & O's  REASON FOR ASSESSMENT:   Consult Enteral/tube feeding initiation and management, Assessment of nutrition requirement/status  ASSESSMENT:   58 year old female who presented to the ED on 10/15 from [facility] with bradycardia. PMH of PEA arrest, quadriplegia and quadriparesis, vent dependent via trach, and PEG tube dependent, CHF, T2DM, GERD, HTN, lymphedema.   Discussed pt with RN and during ICU rounds. Per RN, pt with stage IV pressure injury to sacrum. WOC has been consulted.  RD consulted for TF initiation and management. No information available in paper chart regarding TF regimen at SNF.  Current TF orders: Juven BID, free water 200 ml QID  Patient is on chronic ventilator support via trach. MV: 7.1 L/min Temp (24hrs), Avg:98.8 F (37.1 C), Min:98.2 F (36.8 C), Max:99.1 F (37.3 C)  Medications reviewed and include: Dulcolax, sodium bicarb, IV magnesium sulfate 2 grams  Labs reviewed: sodium 147, magnesium 1.5 CBG's: 88-96 x 24 hours  NUTRITION - FOCUSED PHYSICAL EXAM:    Most Recent Value  Orbital Region  No depletion  Upper Arm Region  No  depletion  Thoracic and Lumbar Region  Mild depletion  Buccal Region  No depletion  Temple Region  No depletion  Clavicle Bone Region  Moderate depletion  Clavicle and Acromion Bone Region  Moderate depletion  Scapular Bone Region  Unable to assess  Dorsal Hand  Mild depletion  Patellar Region  Mild depletion  Anterior Thigh Region  Mild depletion  Posterior Calf Region  Moderate depletion  Edema (RD Assessment)  Mild [generalized]  Hair  Reviewed  Eyes  Reviewed  Mouth  Reviewed  Skin  Reviewed  Nails  Reviewed       Diet Order:   Diet Order    None      EDUCATION NEEDS:   No education needs have been identified at this time  Skin:  Skin Assessment: Skin Integrity Issues: Skin Integrity Issues: Other: pressure injury to buttocks  Last BM:  05/14/19  Height:   Ht Readings from Last 1 Encounters:  12/18/18 5\' 2"  (1.575 m)    Weight:   Wt Readings from Last 1 Encounters:  05/14/19 75.9 kg    Ideal Body Weight:  50 kg (calculated using height from 12/18/18)  BMI:  Body mass index is 30.6 kg/m.  Estimated Nutritional Needs:   Kcal:  1550-1750  Protein:  100-120 grams  Fluid:  1.5-1.7 L    Gaynell Face, MS, RD, LDN Inpatient Clinical Dietitian Pager: [phone] Weekend/After Hours: [phone]

## 2019-06-30 ENCOUNTER — Emergency Department (HOSPITAL_COMMUNITY): Payer: Medicare Other

## 2019-06-30 ENCOUNTER — Encounter (HOSPITAL_COMMUNITY): Payer: Self-pay | Admitting: Emergency Medicine

## 2019-06-30 ENCOUNTER — Emergency Department (HOSPITAL_COMMUNITY)
Admission: EM | Admit: 2019-06-30 | Discharge: 2019-06-30 | Disposition: A | Discharge status: Home / Self Care | Payer: Medicare Other | Attending: Emergency Medicine | Admitting: Emergency Medicine

## 2019-06-30 DIAGNOSIS — Z79899 Other long term (current) drug therapy: Secondary | ICD-10-CM | POA: Diagnosis not present

## 2019-06-30 DIAGNOSIS — Z930 Tracheostomy status: Secondary | ICD-10-CM | POA: Diagnosis not present

## 2019-06-30 DIAGNOSIS — I5030 Unspecified diastolic (congestive) heart failure: Secondary | ICD-10-CM | POA: Diagnosis not present

## 2019-06-30 DIAGNOSIS — R4189 Other symptoms and signs involving cognitive functions and awareness: Secondary | ICD-10-CM

## 2019-06-30 DIAGNOSIS — R079 Chest pain, unspecified: Secondary | ICD-10-CM | POA: Diagnosis not present

## 2019-06-30 DIAGNOSIS — R464 Slowness and poor responsiveness: Secondary | ICD-10-CM | POA: Diagnosis not present

## 2019-06-30 DIAGNOSIS — Z794 Long term (current) use of insulin: Secondary | ICD-10-CM | POA: Insufficient documentation

## 2019-06-30 DIAGNOSIS — G8250 Quadriplegia, unspecified: Secondary | ICD-10-CM | POA: Diagnosis not present

## 2019-06-30 LAB — CBC WITH DIFFERENTIAL/PLATELET
Abs Immature Granulocytes: 0.04 10*3/uL (ref 0.00–0.07)
Basophils Absolute: 0 10*3/uL (ref 0.0–0.1)
Basophils Relative: 0 %
Eosinophils Absolute: 0.1 10*3/uL (ref 0.0–0.5)
Eosinophils Relative: 1 %
HCT: 38.2 % (ref 36.0–46.0)
Hemoglobin: 12 g/dL (ref 12.0–15.0)
Immature Granulocytes: 0 %
Lymphocytes Relative: 12 %
Lymphs Abs: 1.2 10*3/uL (ref 0.7–4.0)
MCH: 28.2 pg (ref 26.0–34.0)
MCHC: 31.4 g/dL (ref 30.0–36.0)
MCV: 89.7 fL (ref 80.0–100.0)
Monocytes Absolute: 0.4 10*3/uL (ref 0.1–1.0)
Monocytes Relative: 4 %
Neutro Abs: 7.9 10*3/uL — ABNORMAL HIGH (ref 1.7–7.7)
Neutrophils Relative %: 83 %
Platelets: 276 10*3/uL (ref 150–400)
RBC: 4.26 MIL/uL (ref 3.87–5.11)
RDW: 15.3 % (ref 11.5–15.5)
WBC: 9.6 10*3/uL (ref 4.0–10.5)
nRBC: 0 % (ref 0.0–0.2)

## 2019-06-30 LAB — POCT I-STAT EG7
Acid-Base Excess: 11 mmol/L — ABNORMAL HIGH (ref 0.0–2.0)
Bicarbonate: 33.9 mmol/L — ABNORMAL HIGH (ref 20.0–28.0)
Calcium, Ion: 1.17 mmol/L (ref 1.15–1.40)
HCT: 37 % (ref 36.0–46.0)
Hemoglobin: 12.6 g/dL (ref 12.0–15.0)
O2 Saturation: 99 %
Potassium: 3.8 mmol/L (ref 3.5–5.1)
Sodium: 138 mmol/L (ref 135–145)
TCO2: 35 mmol/L — ABNORMAL HIGH (ref 22–32)
pCO2, Ven: 38.6 mmHg — ABNORMAL LOW (ref 44.0–60.0)
pH, Ven: 7.552 — ABNORMAL HIGH (ref 7.250–7.430)
pO2, Ven: 132 mmHg — ABNORMAL HIGH (ref 32.0–45.0)

## 2019-06-30 LAB — BASIC METABOLIC PANEL
Anion gap: 10 (ref 5–15)
BUN: 12 mg/dL (ref 6–20)
CO2: 27 mmol/L (ref 22–32)
Calcium: 9.3 mg/dL (ref 8.9–10.3)
Chloride: 99 mmol/L (ref 98–111)
Creatinine, Ser: 0.34 mg/dL — ABNORMAL LOW (ref 0.44–1.00)
GFR calc Af Amer: >60 mL/min (ref >60)
GFR calc non Af Amer: >60 mL/min (ref >60)
Glucose, Bld: 97 mg/dL (ref 70–99)
Potassium: 4.3 mmol/L (ref 3.5–5.1)
Sodium: 136 mmol/L (ref 135–145)

## 2019-06-30 LAB — TROPONIN I (HIGH SENSITIVITY): Troponin I (High Sensitivity): 15 ng/L (ref <18)

## 2019-06-30 MED ORDER — IOHEXOL 350 MG/ML SOLN
100.0000 mL | Freq: Once | INTRAVENOUS | Status: AC | PRN
  Administered 2019-06-30: 100 mL via INTRAVENOUS

## 2019-06-30 MED ORDER — SODIUM CHLORIDE 0.9% FLUSH: 10.0000 mL | Freq: Two times a day (BID) | INTRAVENOUS | Status: DC

## 2019-06-30 MED ORDER — SODIUM CHLORIDE 0.9% FLUSH: 10.0000 mL | INTRAVENOUS | Status: DC | PRN

## 2019-06-30 NOTE — ED Notes (Signed)
Report called to [facility] RN

## 2019-06-30 NOTE — ED Notes (Signed)
Patient verbalizes understanding of discharge instructions. Opportunity for questioning and answers were provided. Armband removed by staff, pt discharged from ED via [facility]. Report called to [facility]

## 2019-06-30 NOTE — ED Triage Notes (Signed)
To ED via [facility]-- from [place]-- had an episode of unresponsiveness while attempting to be suctioned- RT at facility said pt became difficult to wsuction, then became unresponsive for a couple minutes-- on arrival to ED, pt is awake, responsive. Pt is trached, pegged, has a midline IV in left arm.

## 2019-06-30 NOTE — Discharge Instructions (Addendum)
Pamela George had a workup in the ER including blood work and a CT scan.  The CT scan showed no sign of pulmonary embolism or broken ribs.  I suspect she may have had a mucous plug earlier today causing her episode at [place].  She appears to have some aspiration in the right lower lung field, but is afebrile here.  If she develops fevers, I would consider initiating treatment for PNA, but at this time do not believe prophylactic antibiotics would benefit her, and may harm her.  A copy of her blood tests are also included.  Her niece William Dalton [facility]) was informed of workup by phone.

## 2019-06-30 NOTE — Progress Notes (Signed)
Patient transported to CT and back to trauma C without any apparent complications.

## 2019-06-30 NOTE — ED Provider Notes (Signed)
[facility] Provider Note   CSN: [id number] Arrival date & time: 06/30/19  [id number]     History   Chief Complaint Unresponsiveness  HPI Pamela RootsDeborah J George is a 58 y.o. female with a history of quadriplegia, with tracheostomy and vent dependent, obesity, diastolic heart failure, hypokalemia, presenting to the ED with an episode of unresponsiveness.  Per signout provided by the paramedics, the [facility] when the respiratory therapist was having difficulty with suctioning.  While attempting to suction the patient, the patient's oxygen saturation dropped down.  The therapist subsequently attempted to bag her but was having a lot of difficulty with the bag ventilation.  The patient reported became unresponsive, at which time CPR was initiated for approximately 2 minutes.  It is not clear that the patient never lost pulses during this.  No ACLS medications were given.  During the situation the therapist was able to resume easy bagging, and there was high suspicion for mucous plug as a cause of this.  She subsequently is returned to her baseline mental state (the patient is nonverbal but can nod yes or no to questions and is cognizant).    On my exam the patient reports that she has chest pain.  No SOB     HPI  Past Medical History:  Diagnosis Date   Acute on chronic respiratory failure with hypoxia (HCC)    Chronic diastolic heart failure (HCC)    History of diastolic dysfunction    Morbid obesity (HCC)    Obstructive sleep apnea    Quadriplegia and quadriparesis (HCC)     Patient Active Problem List   Diagnosis Date Noted   Hypokalemia 05/14/2019   Hypernatremia 05/14/2019   History of cardiac arrest 05/14/2019   Status post insertion of percutaneous endoscopic gastrostomy (PEG) tube (HCC) 05/14/2019   Hyperlipidemia    Hypomagnesemia    Bradycardia with 31-40 beats per minute, narrow-complex 05/13/2019    Ventilator dependent (HCC)    Acute on chronic respiratory failure with hypoxia (HCC)    Quadriplegia and quadriparesis (HCC)    Obstructive sleep apnea    History of diastolic dysfunction     Past Surgical History:  Procedure Laterality Date   PEG TUBE PLACEMENT     TRACHEOSTOMY       OB History   No obstetric history on file.      Home Medications    Prior to Admission medications   Medication Sig Start Date End Date Taking? Authorizing Provider  acetaminophen (TYLENOL) 325 MG tablet Place 650 mg into feeding tube every 6 (six) hours as needed for mild pain.    Yes [provider]  ALPRAZolam (XANAX) 0.25 MG tablet Place 0.25 mg into feeding tube 2 (two) times daily as needed for anxiety.   Yes [provider]  Amino Acids-Protein Hydrolys (FEEDING SUPPLEMENT, PRO-STAT SUGAR FREE 64,) LIQD Place 30 mLs into feeding tube 2 (two) times daily.   Yes [provider]  atorvastatin (LIPITOR) 40 MG tablet Place 40 mg into feeding tube at bedtime.   Yes [provider]  baclofen (LIORESAL) 10 MG tablet Place 10 mg into feeding tube every 12 (twelve) hours.   Yes [provider]  bisacodyl (DULCOLAX) 10 MG suppository Place 10 mg rectally daily as needed for moderate constipation.   Yes [provider]  bisacodyl (DULCOLAX) 5 MG EC tablet 10 mg 2 (two) times daily. PER TUBE   Yes [provider]  chlorhexidine (PERIDEX) 0.12 % solution Use as directed 5 mLs in the mouth or throat 2 (two) times daily.   Yes [provider]  Cranberry 450 MG CAPS Place 450 mg into feeding tube daily.   Yes [provider]  enoxaparin (LOVENOX) 40 MG/0.4ML injection Inject 40 mg into the skin daily.   Yes [provider]  fentaNYL (DURAGESIC) 75 MCG/HR Place 1 patch onto the skin every 3 (three) days.   Yes [provider]  gabapentin (NEURONTIN) 300 MG capsule Place 300 mg into feeding tube every 8  (eight) hours.   Yes [provider]  insulin lispro (HUMALOG) 100 UNIT/ML injection Inject 2-12 Units into the skin See admin instructions. Four times daily per Sliding Scale: 151-200 2 units 201-250 4 units 251-300 6 units 301-350 8 units 351-400 10 units 401 or greater 12 units & notify MD   Yes [provider]  lactase (LACTAID) 3000 units tablet 3,000 Units daily as needed (STOMACH UPSET). PER TUBE   Yes [provider]  Melatonin 3 MG TABS Place 3 mg into feeding tube at bedtime.   Yes [provider]  nutrition supplement, JUVEN, (JUVEN) PACK Place 1 packet into feeding tube 2 (two) times daily between meals.   Yes [provider]  ondansetron (ZOFRAN) 4 MG tablet Place 4 mg into feeding tube every 6 (six) hours as needed for nausea or vomiting.   Yes [provider]  Oxycodone HCl 10 MG TABS Place 10 mg into feeding tube every 6 (six) hours as needed (PAIN).   Yes [provider]  potassium chloride 20 MEQ/15ML (10%) SOLN Place 30 mLs (40 mEq total) into feeding tube daily. 05/14/19  Yes Ellwood Dense, DO  sennosides-docusate sodium (SENOKOT-S) 8.6-50 MG tablet Place 1 tablet into feeding tube 2 (two) times daily as needed for constipation.   Yes [provider]  sertraline (ZOLOFT) 25 MG tablet Place 25 mg into feeding tube daily.   Yes [provider]  sodium bicarbonate 650 MG tablet Place 650 mg into feeding tube daily as needed for heartburn.   Yes [provider]  Water For Irrigation, Sterile (FREE WATER) SOLN Place 200 mLs into feeding tube every 6 (six) hours. 05/14/19  Yes Ellwood Dense, DO  ceFEPime (MAXIPIME) IVPB Inject 1 g into the vein every 8 (eight) hours.    [provider]    Family History Family History  Problem Relation Age of Onset   Hypertension Mother    Hypertension Father     Social History Social History   Tobacco Use   Smoking status: Never  Smoker   Smokeless tobacco: Never Used  Substance Use Topics   Alcohol use: Not Currently   Drug use: Never     Allergies   Penicillins and Piperacillin-tazobactam in dex   Review of Systems Review of Systems  Reason unable to perform ROS: Limited by patient nonverbal status.  Constitutional: Negative for chills and fever.  Respiratory: Negative for shortness of breath.   Cardiovascular: Positive for chest pain.  Musculoskeletal: Positive for arthralgias and myalgias.  Neurological: Positive for syncope.  All other systems reviewed and are negative.    Physical Exam Updated Vital Signs BP 124/65 (BP Location: Right Arm)    Pulse 60    Temp (!) 97.2 F (36.2 C) (Temporal)    Resp 15    SpO2 100%   Physical Exam Vitals signs and nursing note reviewed.  Constitutional:  General: She is not in acute distress.    Appearance: She is well-developed. She is obese.  HENT:     Head: Normocephalic and atraumatic.  Eyes:     Conjunctiva/sclera: Conjunctivae normal.     Pupils: Pupils are equal, round, and reactive to light.  Neck:     Musculoskeletal: Neck supple.     Comments: Tracheostomy tube in place Cardiovascular:     Rate and Rhythm: Normal rate and regular rhythm.     Heart sounds: No murmur.  Pulmonary:     Comments: 100% O2 saturation on ventilator Peak pressure 28 mmhg  PEEP 8 Left lung fields clear to auscultation Right lower field with diminished breath sounds Abdominal:     Palpations: Abdomen is soft.     Tenderness: There is no abdominal tenderness.     Comments: G tube   Skin:    General: Skin is warm and dry.  Neurological:     Mental Status: She is alert.      ED Treatments / Results  Labs (all labs ordered are listed, but only abnormal results are displayed) Labs Reviewed  BASIC METABOLIC PANEL - Abnormal; Notable for the following components:      Result Value   Creatinine, Ser 0.34 (*)    All other components within normal limits    CBC WITH DIFFERENTIAL/PLATELET - Abnormal; Notable for the following components:   Neutro Abs 7.9 (*)    All other components within normal limits  POCT I-STAT EG7 - Abnormal; Notable for the following components:   pH, Ven 7.552 (*)    pCO2, Ven 38.6 (*)    pO2, Ven 132.0 (*)    Bicarbonate 33.9 (*)    TCO2 35 (*)    Acid-Base Excess 11.0 (*)    All other components within normal limits  I-STAT VENOUS BLOOD GAS, ED  TROPONIN I (HIGH SENSITIVITY)    EKG EKG Interpretation  Date/Time:  Wednesday June 30 2019 09:50:45 EST Ventricular Rate:  83 PR Interval:    QRS Duration: 75 QT Interval:  448 QTC Calculation: 527 R Axis:   27 Text Interpretation: Sinus rhythm Low voltage, precordial leads Nonspecific T abnormalities, diffuse leads Prolonged QT interval No STEMI Confirmed by Octaviano Glow [phone]) on 06/30/2019 9:55:09 AM   Radiology Ct Angio Chest Pe W And/or Wo Contrast  Result Date: 06/30/2019 CLINICAL DATA:  Chest pain, cardiac arrest EXAM: CT ANGIOGRAPHY CHEST WITH CONTRAST TECHNIQUE: Multidetector CT imaging of the chest was performed using the standard protocol during bolus administration of intravenous contrast. Multiplanar CT image reconstructions and MIPs were obtained to evaluate the vascular anatomy. CONTRAST:  125mL OMNIPAQUE IOHEXOL 350 MG/ML SOLN COMPARISON:  None. FINDINGS: Cardiovascular: Satisfactory opacification of the pulmonary arteries to the segmental level. No evidence of pulmonary embolism. Normal heart size. No pericardial effusion. Mild calcified plaque along aortic arch. Mediastinum/Nodes: No adenopathy. Lungs/Pleura: Tracheostomy device is present. There is debris within the right mainstem bronchus and bronchus intermedius. Right lower lobe patchy inferior right upper ground-glass density. No pleural effusion or pneumothorax Upper Abdomen: No acute abnormality. Musculoskeletal: Sternum is intact.  No rib fractures identified. Review of the MIP images  confirms the above findings. IMPRESSION: No evidence of acute embolism. Debris within the right mainstem bronchus with patchy density in the right lung. This may reflect aspiration. Electronically Signed   By: Macy Mis M.D.   On: 06/30/2019 14:52   Dg Chest Portable 1 View  Result Date: 06/30/2019 CLINICAL DATA:  , Suspicion  for pneumothorax versus pneumonia, episode of unresponsiveness at an outside facility. EXAM: PORTABLE CHEST 1 VIEW COMPARISON:  05/13/2019 FINDINGS: Film is rotated to the right. Tracheostomy tube remains in place. Pacer defibrillator pad over the right chest. Heart size is stable. Lung volumes are slightly increased compared to prior exam. Accounting for limitations on the current study there is no sign of pneumothorax. Vascular crowding without frank edema. There is blunting of the left costophrenic angle, no signs of dense consolidation. Severe shoulder arthropathy again noted without acute bone finding. IMPRESSION: 1. No evidence of pneumothorax or consolidation. 2. Vascular crowding without frank edema. 3. Blunting of the left costophrenic angle, likely a small pleural effusion. Electronically Signed   By: Donzetta Kohut M.D.   On: 06/30/2019 10:08    Procedures Procedures (including critical care time)  Medications Ordered in ED Medications  sodium chloride flush (NS) 0.9 % injection 10-40 mL (10 mLs Intracatheter Not Given 06/30/19 1256)  sodium chloride flush (NS) 0.9 % injection 10-40 mL (has no administration in time range)  iohexol (OMNIPAQUE) 350 MG/ML injection 100 mL (100 mLs Intravenous Contrast Given 06/30/19 1441)     Initial Impression / Assessment and Plan / ED Course  I have reviewed the triage vital signs and the nursing notes.  Pertinent labs & imaging results that were available during my care of the patient were reviewed by me and considered in my medical decision making (see chart for details).  This is a 58 year old female presenting to the ED  from a skilled nursing facility with an episode of unresponsiveness.  This began initially with a respiratory therapist at the facility having difficulty suctioning the patient and subsequently bagging the patient.  The difficulty with ventilation spontaneously resolved after 2 minutes of chest compressions, which raises the probability of a transient ventilation process such as a mucous plug. This is less likely a pneumothorax given the resolution of her symptoms spontaneously, and also given her relatively lower peak pressure on the ventilator here.  Here the patient appears to be mentating at her baseline status.  I have a lower concern for stroke/TIA at this time - I believe her unresponsiveness was related to her respiratory issues.  Patient is stable on arrival here, on baseline Vent settings per paramedic team  She has chest pain from her compressions Will check ecg, trop CT PE to rule out VTE as cause of symptoms, and also to assess for rib fx  Clinical Course as of Jun 29 1734  Wed Jun 30, 2019  1407 CT to call for her next   [MT]  1439 Pt at CT   [MT]  1504 IMPRESSION: No evidence of acute embolism. Debris within the right mainstem bronchus with patchy density in the right lung. This may reflect aspiration.   [MT]  1522 I spoke to Remer Macho the patient's niece and PoA and explained the events today and our workup.  I answered all of her questions to the best of my abilities.  I explained we would be discharging the patient back to [facility], as I suspect there was a mucous plug causing her event earlier today.  This was also relayed to the patient.   [MT]    Clinical Course User Index [MT] Shereka Lafortune, Kermit Balo, MD   This note was dictated using dragon dictation software.  Please be aware that there may be minor translation errors as a result of this oral dictation    Final Clinical Impressions(s) / ED Diagnoses  Final diagnoses:  Unresponsiveness    ED Discharge Orders      None       Terald Sleeper, MD 06/30/19 [phone]

## 2019-11-26 DIAGNOSIS — M48062 Spinal stenosis, lumbar region with neurogenic claudication: Secondary | ICD-10-CM | POA: Diagnosis not present

## 2019-11-26 DIAGNOSIS — Z930 Tracheostomy status: Secondary | ICD-10-CM | POA: Diagnosis not present

## 2019-11-26 DIAGNOSIS — J9621 Acute and chronic respiratory failure with hypoxia: Secondary | ICD-10-CM | POA: Diagnosis not present

## 2019-12-06 DIAGNOSIS — M48062 Spinal stenosis, lumbar region with neurogenic claudication: Secondary | ICD-10-CM | POA: Diagnosis not present

## 2019-12-06 DIAGNOSIS — Z930 Tracheostomy status: Secondary | ICD-10-CM | POA: Diagnosis not present

## 2019-12-06 DIAGNOSIS — J9621 Acute and chronic respiratory failure with hypoxia: Secondary | ICD-10-CM | POA: Diagnosis not present

## 2019-12-08 DIAGNOSIS — Z930 Tracheostomy status: Secondary | ICD-10-CM | POA: Diagnosis not present

## 2019-12-08 DIAGNOSIS — J9621 Acute and chronic respiratory failure with hypoxia: Secondary | ICD-10-CM | POA: Diagnosis not present

## 2019-12-08 DIAGNOSIS — M48062 Spinal stenosis, lumbar region with neurogenic claudication: Secondary | ICD-10-CM | POA: Diagnosis not present

## 2019-12-10 DIAGNOSIS — M4800 Spinal stenosis, site unspecified: Secondary | ICD-10-CM

## 2019-12-10 DIAGNOSIS — Z930 Tracheostomy status: Secondary | ICD-10-CM

## 2019-12-10 DIAGNOSIS — J9621 Acute and chronic respiratory failure with hypoxia: Secondary | ICD-10-CM

## 2019-12-12 DIAGNOSIS — M48062 Spinal stenosis, lumbar region with neurogenic claudication: Secondary | ICD-10-CM | POA: Diagnosis not present

## 2019-12-12 DIAGNOSIS — J9621 Acute and chronic respiratory failure with hypoxia: Secondary | ICD-10-CM | POA: Diagnosis not present

## 2019-12-12 DIAGNOSIS — Z930 Tracheostomy status: Secondary | ICD-10-CM | POA: Diagnosis not present

## 2019-12-20 DIAGNOSIS — M4800 Spinal stenosis, site unspecified: Secondary | ICD-10-CM

## 2019-12-20 DIAGNOSIS — Z930 Tracheostomy status: Secondary | ICD-10-CM

## 2019-12-20 DIAGNOSIS — M48062 Spinal stenosis, lumbar region with neurogenic claudication: Secondary | ICD-10-CM | POA: Diagnosis not present

## 2019-12-20 DIAGNOSIS — J9621 Acute and chronic respiratory failure with hypoxia: Secondary | ICD-10-CM | POA: Diagnosis not present

## 2019-12-22 DIAGNOSIS — Z930 Tracheostomy status: Secondary | ICD-10-CM | POA: Diagnosis not present

## 2019-12-22 DIAGNOSIS — M4800 Spinal stenosis, site unspecified: Secondary | ICD-10-CM | POA: Diagnosis not present

## 2019-12-22 DIAGNOSIS — M48062 Spinal stenosis, lumbar region with neurogenic claudication: Secondary | ICD-10-CM | POA: Diagnosis not present

## 2019-12-22 DIAGNOSIS — J9621 Acute and chronic respiratory failure with hypoxia: Secondary | ICD-10-CM | POA: Diagnosis not present

## 2020-01-04 DIAGNOSIS — M4800 Spinal stenosis, site unspecified: Secondary | ICD-10-CM | POA: Diagnosis not present

## 2020-01-04 DIAGNOSIS — Z930 Tracheostomy status: Secondary | ICD-10-CM | POA: Diagnosis not present

## 2020-01-04 DIAGNOSIS — J9621 Acute and chronic respiratory failure with hypoxia: Secondary | ICD-10-CM | POA: Diagnosis not present

## 2020-04-16 ENCOUNTER — Encounter (HOSPITAL_COMMUNITY): Payer: Self-pay

## 2020-04-16 ENCOUNTER — Other Ambulatory Visit: Payer: Self-pay

## 2020-04-16 ENCOUNTER — Emergency Department (HOSPITAL_COMMUNITY)
Admission: EM | Admit: 2020-04-16 | Discharge: 2020-04-17 | Disposition: A | Discharge status: Other Acute Inpatient Hospital | Payer: Medicare Other | Attending: Emergency Medicine | Admitting: Emergency Medicine

## 2020-04-16 DIAGNOSIS — I5032 Chronic diastolic (congestive) heart failure: Secondary | ICD-10-CM | POA: Diagnosis not present

## 2020-04-16 DIAGNOSIS — I493 Ventricular premature depolarization: Secondary | ICD-10-CM

## 2020-04-16 DIAGNOSIS — Z7901 Long term (current) use of anticoagulants: Secondary | ICD-10-CM | POA: Insufficient documentation

## 2020-04-16 DIAGNOSIS — R001 Bradycardia, unspecified: Secondary | ICD-10-CM | POA: Diagnosis present

## 2020-04-16 LAB — BASIC METABOLIC PANEL
Anion gap: 9 (ref 5–15)
BUN: 21 mg/dL (ref 8–23)
CO2: 33 mmol/L — ABNORMAL HIGH (ref 22–32)
Calcium: 9.2 mg/dL (ref 8.9–10.3)
Chloride: 99 mmol/L (ref 98–111)
Creatinine, Ser: 0.33 mg/dL — ABNORMAL LOW (ref 0.44–1.00)
GFR calc Af Amer: >60 mL/min (ref >60)
GFR calc non Af Amer: >60 mL/min (ref >60)
Glucose, Bld: 94 mg/dL (ref 70–99)
Potassium: 3.7 mmol/L (ref 3.5–5.1)
Sodium: 141 mmol/L (ref 135–145)

## 2020-04-16 LAB — CBC WITH DIFFERENTIAL/PLATELET
Abs Immature Granulocytes: 0.02 10*3/uL (ref 0.00–0.07)
Basophils Absolute: 0 10*3/uL (ref 0.0–0.1)
Basophils Relative: 0 %
Eosinophils Absolute: 0.1 10*3/uL (ref 0.0–0.5)
Eosinophils Relative: 2 %
HCT: 41.7 % (ref 36.0–46.0)
Hemoglobin: 12.8 g/dL (ref 12.0–15.0)
Immature Granulocytes: 0 %
Lymphocytes Relative: 21 %
Lymphs Abs: 1.2 10*3/uL (ref 0.7–4.0)
MCH: 30.2 pg (ref 26.0–34.0)
MCHC: 30.7 g/dL (ref 30.0–36.0)
MCV: 98.3 fL (ref 80.0–100.0)
Monocytes Absolute: 0.3 10*3/uL (ref 0.1–1.0)
Monocytes Relative: 6 %
Neutro Abs: 4.1 10*3/uL (ref 1.7–7.7)
Neutrophils Relative %: 71 %
Platelets: 205 10*3/uL (ref 150–400)
RBC: 4.24 MIL/uL (ref 3.87–5.11)
RDW: 13.6 % (ref 11.5–15.5)
WBC: 5.8 10*3/uL (ref 4.0–10.5)
nRBC: 0 % (ref 0.0–0.2)

## 2020-04-16 NOTE — ED Notes (Signed)
Called [facility] for transport.  

## 2020-04-16 NOTE — Discharge Instructions (Addendum)
The testing today did not show any serious problems.  You had some PVCs, earlier, but they do not seem to be causing a problem at this time.  You have a history of bradycardia with rates in the 30s to 40s.  Today the heart rate has been above 60, while being observed.  This is a normal heart rate.  If you develop weakness, dizziness, low blood pressure or have other concerning signs associated with cardiac arrhythmia, please be evaluated by your doctor.

## 2020-04-16 NOTE — ED Notes (Signed)
Called sister Suzette Battiest and gave update.

## 2020-04-16 NOTE — ED Triage Notes (Signed)
To room via EMS from [facility].  Onset yesterday staff reported pt bradycardic.  Pt has h/o bradycardia that usually corrects itself but after IVF 500 ml pt still bradycardic.   EMS BP 124/96 HR 64 Temp 97.1 On oxygen Tbar @ 4L CBG 109 Pt was given methadone and xanax prior to be transported to ED.  Denies chest pain, shortness of breath.

## 2020-04-16 NOTE — ED Notes (Signed)
[phone], Suzette Battiest, Sister would like an update

## 2020-04-16 NOTE — ED Provider Notes (Signed)
[facility] EMERGENCY DEPARTMENT Provider Note   CSN: [id number] Arrival date & time: 04/16/20  1431     History Chief Complaint  Patient presents with  . Bradycardia    Pamela George is a 59 y.o. female.  HPI Patient arrives here from [facility], a [facility].  She reportedly has a heart rate of 60 based on EKG, and was treated with saline for that.  She was evaluated with CBC and BMP, 04/11/20, results are available, and are normal except chloride low 98, CO2 high at 33, glucose high BUN high at 29, creatinine low, 0.4.  She has a history of a narrow complex bradycardia.  Additional notes with the patient document her medications.  EKG is done, today, at 12:23 and 12:49 PM, initially showed sinus rhythm, and secondly showed ventricular bigeminy.  Level 5 caveat-patient difficult to understand because physiologic vocalize, and can only move her lips, and nod.  She indicates that she does not have pain, and is not uncomfortable.  Unable to get any additional meaningful response.    Past Medical History:  Diagnosis Date  . Acute on chronic respiratory failure with hypoxia (HCC)   . Chronic diastolic heart failure (HCC)   . History of diastolic dysfunction   . Morbid obesity (HCC)   . Obstructive sleep apnea   . Quadriplegia and quadriparesis [facility])     Patient Active Problem List   Diagnosis Date Noted  . Hypokalemia 05/14/2019  . Hypernatremia 05/14/2019  . History of cardiac arrest 05/14/2019  . 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 Hyperlipidemia   . Hypomagnesemia   . Bradycardia with 31-40 beats per minute, narrow-complex 05/13/2019  . Ventilator dependent (HCC)   . Acute on chronic respiratory failure with hypoxia (HCC)   . Quadriplegia and quadriparesis (HCC)   . Obstructive sleep apnea   . History of diastolic dysfunction     Past Surgical History:  Procedure Laterality Date  .  PEG TUBE PLACEMENT    . TRACHEOSTOMY       OB History   No obstetric history on file.     Family History  Problem Relation Age of Onset  . Hypertension Mother   . Hypertension Father     Social History   Tobacco Use  . Smoking status: Never Smoker  . Smokeless tobacco: Never Used  Substance Use Topics  . Alcohol use: Not Currently  . Drug use: Never    Home Medications Prior to Admission medications   Medication Sig Start Date End Date Taking? Authorizing Provider  acetaminophen (TYLENOL) 325 MG tablet Place 650 mg into feeding tube every 6 (six) hours as needed for mild pain.    Yes [provider]  ALPRAZolam (XANAX) 0.25 MG tablet Place 0.25 mg into feeding tube 2 (two) times daily as needed for anxiety.   Yes [provider]  Amino Acids-Protein Hydrolys (FEEDING SUPPLEMENT, PRO-STAT SUGAR FREE 64,) LIQD Place 30 mLs into feeding tube 2 (two) times daily.   Yes [provider]  atorvastatin (LIPITOR) 40 MG tablet Place 40 mg into feeding tube at bedtime.   Yes [provider]  baclofen (LIORESAL) 10 MG tablet Place 10 mg into feeding tube every 12 (twelve) hours.   Yes [provider]  bisacodyl (DULCOLAX) 5 MG EC tablet 10 mg 2 (two) times daily. PER TUBE   Yes [provider]  chlorhexidine (PERIDEX) 0.12 % solution Use as directed 5  mLs in the mouth or throat 2 (two) times daily.   Yes [provider]  Cranberry 450 MG CAPS Place 450 mg into feeding tube daily.   Yes [provider]  enoxaparin (LOVENOX) 40 MG/0.4ML injection Inject 40 mg into the skin daily.   Yes [provider]  gabapentin (NEURONTIN) 300 MG capsule Place 300 mg into feeding tube every 8 (eight) hours.   Yes [provider]  lactase (LACTAID) 3000 units tablet 3,000 Units daily as needed (STOMACH UPSET). PER TUBE   Yes [provider]  Melatonin 3 MG TABS Place 3 mg into feeding tube at bedtime.   Yes  [provider]  methadone (DOLOPHINE) 10 MG tablet Take 10 mg by mouth daily. 03/17/20  Yes [provider]  nutrition supplement, JUVEN, (JUVEN) PACK Place 1 packet into feeding tube 2 (two) times daily between meals.   Yes [provider]  ondansetron (ZOFRAN) 4 MG tablet Place 4 mg into feeding tube every 6 (six) hours as needed for nausea or vomiting.   Yes [provider]  Oxycodone HCl 10 MG TABS Place 10 mg into feeding tube every 6 (six) hours as needed (PAIN).   Yes [provider]  potassium chloride 20 MEQ/15ML (10%) SOLN Place 30 mLs (40 mEq total) into feeding tube daily. 05/14/19  Yes Caro Laroche, DO  sennosides-docusate sodium (SENOKOT-S) 8.6-50 MG tablet Place 1 tablet into feeding tube 2 (two) times daily as needed for constipation.   Yes [provider]  sertraline (ZOLOFT) 25 MG tablet Place 25 mg into feeding tube daily.   Yes [provider]  sodium bicarbonate 650 MG tablet Place 650 mg into feeding tube daily as needed for heartburn.   Yes [provider]  Water For Irrigation, Sterile (FREE WATER) SOLN Place 200 mLs into feeding tube every 6 (six) hours. 05/14/19  Yes Caro Laroche, DO  ceFEPime (MAXIPIME) IVPB Inject 1 g into the vein every 8 (eight) hours.    [provider]    Allergies    Penicillins and Piperacillin-tazobactam in dex  Review of Systems   Review of Systems  Unable to perform ROS: Other    Physical Exam Updated Vital Signs BP 118/66 (BP Location: Left Arm)   Pulse (!) 57   Temp 98.6 F (37 C) (Oral)   Resp 16   SpO2 100%   Physical Exam Vitals and nursing note reviewed.  Constitutional:      General: She is not in acute distress.    Appearance: She is well-developed. She is obese. She is not ill-appearing or diaphoretic.  HENT:     Head: Normocephalic and atraumatic.     Right Ear: External ear normal.     Left Ear: External ear normal.  Eyes:       Conjunctiva/sclera: Conjunctivae normal.     Pupils: Pupils are equal, round, and reactive to light.  Neck:     Trachea: Phonation normal.  Cardiovascular:     Rate and Rhythm: Normal rate and regular rhythm.     Heart sounds: Normal heart sounds.  Pulmonary:     Effort: Pulmonary effort is normal.     Breath sounds: Normal breath sounds.  Abdominal:     General: There is no distension.  Musculoskeletal:        General: No deformity.     Cervical back: Normal range of motion and neck supple.  Skin:    General: Skin is warm and  dry.  Neurological:     Mental Status: She is alert.     Cranial Nerves: No cranial nerve deficit.     Motor: No abnormal muscle tone.     Comments: Alert and responsive.  Psychiatric:        Mood and Affect: Mood normal.        Behavior: Behavior normal.     ED Results / Procedures / Treatments   Labs (all labs ordered are listed, but only abnormal results are displayed) Labs Reviewed  BASIC METABOLIC PANEL - Abnormal; Notable for the following components:      Result Value   CO2 33 (*)    Creatinine, Ser 0.33 (*)    All other components within normal limits  CBC WITH DIFFERENTIAL/PLATELET    EKG EKG Interpretation  Date/Time:  Sunday April 16 2020 14:33:17 EDT Ventricular Rate:  60 PR Interval:    QRS Duration: 87 QT Interval:  434 QTC Calculation: 434 R Axis:   -12 Text Interpretation: Sinus rhythm LVH by voltage Borderline T abnormalities, anterior leads Since last tracing rate slower Otherwise no significant change Confirmed by Mancel BaleWentz, Teodoro Jeffreys [phone]) on 04/16/2020 2:42:49 PM   Radiology No results found.  Procedures Procedures (including critical care time)  Medications Ordered in ED Medications - No data to display  ED Course  I have reviewed the triage vital signs and the nursing notes.  Pertinent labs & imaging results that were available during my care of the patient were reviewed by me and considered in my  medical decision making (see chart for details).  Clinical Course as of Apr 16 1900  Wynelle LinkSun Apr 16, 2020  [id number] Normal except CO2 slightly elevated, creatinine slightly low  Basic metabolic panel(!) [EW]  1856 Normal  CBC with Differential [EW]    Clinical Course User Index [EW] Mancel BaleWentz, Kimbely Whiteaker, MD   MDM Rules/Calculators/A&P                           Patient Vitals for the past 24 hrs:  BP Temp Temp src Pulse Resp SpO2  04/16/20 1545 118/66 -- -- (!) 57 16 100 %  04/16/20 1433 115/70 98.6 F (37 C) Oral 61 15 100 %  04/16/20 1430 -- -- -- -- -- 100 %    7:01 PM Reevaluation with update and discussion. After initial assessment and treatment, an updated evaluation reveals no arrhythmia alarms on cardiac monitor. Mancel BaleElliott Elainah Rhyne   Medical Decision Making:  This patient is presenting for evaluation of bradycardia with PVCs, which does require a range of treatment options, and is a complaint that involves a moderate risk of morbidity and mortality. The differential diagnoses include electrolyte abnormality, cardiac rate suppression, acute cardiac disorder. I decided to review old records, and in summary quadriplegic patient presenting for evaluation of bradycardia, with history of same.  Also noted PVCs on EKG today.  Unknown if she has had these previously.  No reported syncope, or other changes in clinical status.  Patient unable to contribute to history.  I did not require additional historical information from anyone.  Clinical Laboratory Tests Ordered, included CBC and Metabolic panel. Review indicates normal findings.   Cardiac Monitor Tracing which shows normal sinus rhythm     Critical Interventions-clinical evaluation, laboratory testing, cardiac monitor, observation reassessment  After These Interventions, the Patient was reevaluated and was found stable for discharge  CRITICAL CARE-no Performed by: Mancel BaleElliott Marisol Giambra  Nursing Notes Reviewed/ Care Coordinated Applicable  Imaging Reviewed Interpretation of Laboratory Data incorporated into ED treatment  The patient appears reasonably screened and/or stabilized for discharge and I doubt any other medical condition or other [facility] requiring further screening, evaluation, or treatment in the ED at this time prior to discharge.  Plan: Home Medications-continue usual medicines; Home Treatments-regular activity; return here if the recommended treatment, does not improve the symptoms; Recommended follow up-PCP, as needed     Final Clinical Impression(s) / ED Diagnoses Final diagnoses:  PVC (premature ventricular contraction)    Rx / DC Orders ED Discharge Orders    None       Mancel Bale, MD 04/16/20 1902

## 2020-04-17 NOTE — ED Notes (Signed)
Pt found incontinent of urine and stool. Patient cleaned for transport via [facility] to [facility]

## 2020-10-24 ENCOUNTER — Other Ambulatory Visit (HOSPITAL_COMMUNITY): Payer: Self-pay | Admitting: Neurosurgery

## 2020-10-24 DIAGNOSIS — G8250 Quadriplegia, unspecified: Secondary | ICD-10-CM

## 2020-10-26 ENCOUNTER — Other Ambulatory Visit: Payer: Self-pay | Admitting: Internal Medicine

## 2020-10-26 DIAGNOSIS — G8252 Quadriplegia, C1-C4 incomplete: Secondary | ICD-10-CM

## 2020-11-07 ENCOUNTER — Ambulatory Visit (HOSPITAL_COMMUNITY)
Admission: RE | Admit: 2020-11-07 | Discharge: 2020-11-07 | Disposition: A | Discharge status: Home / Self Care | Payer: Medicare Other | Source: Ambulatory Visit | Attending: Internal Medicine | Admitting: Internal Medicine

## 2020-11-07 ENCOUNTER — Other Ambulatory Visit: Payer: Self-pay

## 2020-11-07 ENCOUNTER — Encounter (HOSPITAL_COMMUNITY): Payer: Self-pay

## 2020-11-07 DIAGNOSIS — G8252 Quadriplegia, C1-C4 incomplete: Secondary | ICD-10-CM

## 2020-11-28 ENCOUNTER — Other Ambulatory Visit: Payer: Self-pay

## 2020-11-28 ENCOUNTER — Ambulatory Visit (HOSPITAL_COMMUNITY)
Admission: RE | Admit: 2020-11-28 | Discharge: 2020-11-28 | Disposition: A | Discharge status: Home / Self Care | Payer: Medicare Other | Source: Ambulatory Visit | Attending: Internal Medicine | Admitting: Internal Medicine

## 2020-11-28 DIAGNOSIS — G8252 Quadriplegia, C1-C4 incomplete: Secondary | ICD-10-CM | POA: Diagnosis not present

## 2021-01-05 ENCOUNTER — Telehealth: Payer: Self-pay

## 2021-01-05 NOTE — Telephone Encounter (Signed)
NOTES ON FILE FROM [facility], SENT REFERRAL TO SCHEDULING-

## 2021-01-08 NOTE — Telephone Encounter (Signed)
Pamela George from [facility] is wanting to be contacted once the appointment is scheduled @ [phone]

## 2021-01-29 ENCOUNTER — Emergency Department (HOSPITAL_COMMUNITY): Payer: Medicare Other

## 2021-01-29 ENCOUNTER — Emergency Department (HOSPITAL_COMMUNITY)
Admission: EM | Admit: 2021-01-29 | Discharge: 2021-01-29 | Disposition: A | Discharge status: Home / Self Care | Payer: Medicare Other | Attending: Emergency Medicine | Admitting: Emergency Medicine

## 2021-01-29 ENCOUNTER — Other Ambulatory Visit: Payer: Self-pay

## 2021-01-29 DIAGNOSIS — K9423 Gastrostomy malfunction: Secondary | ICD-10-CM | POA: Diagnosis not present

## 2021-01-29 DIAGNOSIS — Z79899 Other long term (current) drug therapy: Secondary | ICD-10-CM | POA: Insufficient documentation

## 2021-01-29 DIAGNOSIS — I5032 Chronic diastolic (congestive) heart failure: Secondary | ICD-10-CM | POA: Diagnosis not present

## 2021-01-29 DIAGNOSIS — T85528A Displacement of other gastrointestinal prosthetic devices, implants and grafts, initial encounter: Secondary | ICD-10-CM

## 2021-01-29 MED ORDER — DIATRIZOATE MEGLUMINE & SODIUM 66-10 % PO SOLN
ORAL | Status: AC
  Administered 2021-01-29: 30 mL via GASTROSTOMY
  Filled 2021-01-29: qty 30

## 2021-01-29 NOTE — ED Notes (Signed)
Attempted report to [facility], spoke to RT at facility

## 2021-01-29 NOTE — ED Triage Notes (Signed)
Pt from [facility] via [facility] for eval of peg tube being out. Facility does not know for how long the peg tube has been out.

## 2021-01-29 NOTE — ED Notes (Signed)
Richardean Sale sister [phone] would like an update

## 2021-01-29 NOTE — ED Provider Notes (Signed)
[facility] EMERGENCY DEPARTMENT Provider Note   CSN: [id number] Arrival date & time: 01/29/21  1059     History No chief complaint on file.   Pamela George is a 60 y.o. female.  The history is provided by the patient and medical records.  Pamela George is a 60 y.o. female who presents to the Emergency Department complaining of peg tube out. She presents the emergency department by critical care transport from [facility] for evaluation of gastrostomy tube misplacement. She does have a peg in place and it came out at an unknown time. She does require the peg for medications. She states that she can't swallow food. She denies any current complaints. She is on Lovenox subcu daily. Per records she was admitted to [facility] skilled [facility] facility on January 21 of 2020 and has had a peg tube in for at least that long. Per report she had a 24 French peg tube in place. Staff attempted to replace it and were unsuccessful.    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      Family History  Problem Relation Age of Onset   Hypertension Mother    Hypertension Father     Social History   Tobacco Use   Smoking status: Never   Smokeless tobacco: Never  Substance Use Topics   Alcohol use: Not Currently   Drug use: Never    Home Medications Prior to Admission medications   Medication Sig Start Date End Date Taking? Authorizing Provider  acetaminophen (TYLENOL) 325 MG tablet Place 650 mg into feeding tube every 6 (six) hours as needed for mild pain.     [provider]  ALPRAZolam Prudy Feeler) 0.25 MG tablet Place 0.25 mg into feeding tube 2 (two) times daily as needed for anxiety.    [provider]  Amino Acids-Protein Hydrolys (FEEDING SUPPLEMENT, PRO-STAT SUGAR FREE 64,) LIQD Place 30 mLs into feeding tube 2 (two) times daily.    [provider]  atorvastatin (LIPITOR) 40 MG tablet Place 40 mg into feeding tube at bedtime.    [provider]  baclofen (LIORESAL) 10 MG tablet Place 10 mg into feeding tube every 12 (twelve) hours.    [provider]  bisacodyl (DULCOLAX) 5 MG EC tablet 10 mg 2 (two) times daily. PER TUBE    [provider]  ceFEPime (MAXIPIME) IVPB Inject 1 g into the vein every 8 (eight) hours.    [provider]  chlorhexidine (PERIDEX) 0.12 % solution Use as directed 5 mLs in the mouth or throat 2 (two) times daily.    [provider]  Cranberry 450 MG CAPS Place 450 mg into feeding tube daily.    [provider]  enoxaparin (LOVENOX) 40 MG/0.4ML injection Inject 40 mg into the skin daily.    [provider]  gabapentin (NEURONTIN) 300 MG capsule Place 300 mg into feeding tube every 8 (eight) hours.    [provider]  lactase (LACTAID) 3000 units tablet 3,000 Units daily as needed (STOMACH UPSET). PER TUBE    [provider]  Melatonin 3 MG TABS Place 3 mg into feeding tube at bedtime.    [provider]  methadone  (DOLOPHINE) 10 MG tablet Take 10 mg by mouth daily. 03/17/20   [provider]  nutrition supplement, JUVEN, (JUVEN) PACK Place 1 packet into feeding tube 2 (two) times daily between meals.    [provider]  ondansetron (ZOFRAN) 4 MG tablet Place 4 mg into feeding tube every 6 (six) hours as needed for nausea or vomiting.    [provider]  Oxycodone HCl 10 MG TABS Place 10 mg into feeding tube every 6 (six) hours as needed (PAIN).    [provider]  potassium chloride 20 MEQ/15ML (10%) SOLN Place 30 mLs (40 mEq total) into feeding tube daily. 05/14/19   Caro Laroche, DO  sennosides-docusate sodium (SENOKOT-S) 8.6-50 MG tablet Place 1 tablet into feeding tube 2 (two) times daily as needed for constipation.    [provider]  sertraline (ZOLOFT) 25 MG tablet Place 25 mg into feeding tube daily.    [provider]  sodium bicarbonate 650 MG tablet Place 650 mg into feeding tube daily as needed for heartburn.    [provider]  Water For Irrigation, Sterile (FREE WATER) SOLN Place 200 mLs into feeding tube every 6 (six) hours. 05/14/19   Caro Laroche, DO    Allergies    Penicillins and Piperacillin-tazobactam in dex  Review of Systems   Review of Systems  All other systems reviewed and are negative.  Physical Exam Updated Vital Signs BP (!) 150/87   Pulse (!) 56   Temp (!) 97.5 F (36.4 C) (Oral)   Resp 17   SpO2 99%   Physical Exam Vitals and nursing note reviewed.  Constitutional:      Appearance: She is well-developed.     Comments: Chronically ill appearing.  HENT:     Head: Normocephalic and atraumatic.  Cardiovascular:     Rate and Rhythm: Normal rate and regular rhythm.     Heart sounds: No murmur heard. Pulmonary:     Effort: Pulmonary effort is normal. No respiratory distress.     Breath sounds: Normal breath sounds.     Comments: Tracheostomy in place. Tracheostomy to trach collar.   Abdominal:     Palpations: Abdomen is soft.     Tenderness: There is no abdominal tenderness. There is no guarding or rebound.     Comments: There is a stoma in the upper abdomen with small amount of blood.   Musculoskeletal:        General: No tenderness.     Comments: Nonpitting edema to BLE  Skin:    General: Skin is warm and dry.  Neurological:     Mental Status: She is alert and oriented to person, place, and time.  Psychiatric:        Behavior: Behavior  normal.    ED Results / Procedures / Treatments   Labs (all labs ordered are listed, but only abnormal results are displayed) Labs Reviewed - No data to display  EKG None  Radiology DG ABDOMEN PEG TUBE LOCATION  Result Date: 01/29/2021 CLINICAL DATA:  Peg tube placement. EXAM: ABDOMEN - 1 VIEW COMPARISON:  None. FINDINGS: The bowel gas pattern is normal. Gastrostomy tube appears to be within gastric lumen. Contrast is seen filling the gastric lumen without evidence of extravasation. Patient's position is rotated. IMPRESSION: Gastrostomy tube appears to be within gastric lumen. Electronically Signed   By: Lupita Raider M.D.   On: 01/29/2021 13:38    Procedures FEEDING TUBE REPLACEMENT  Date/Time: 01/29/2021 2:57 PM Performed by: Tilden Fossa, MD Authorized by: Tilden Fossa, MD  Consent: Verbal consent obtained. Patient identity confirmed: verbally with patient Indications: tube dislodged Local anesthesia used: no  Anesthesia: Local anesthesia used: no  Sedation: Patient sedated: no  Tube type: gastrostomy Patient position: supine Tube size: 25fr. Bulb inflation volume: 6 (ml) Placement/position confirmation: x-ray, contrast and gastric contents aspirated Tube placement difficulty: none Patient tolerance: patient tolerated the procedure well with no immediate complications     Medications Ordered in ED Medications  diatrizoate meglumine-sodium (GASTROGRAFIN) 66-10 % solution (30 mLs PEG Tube Given  01/29/21 1321)    ED Course  I have reviewed the triage vital signs and the nursing notes.  Pertinent labs & imaging results that were available during my care of the patient were reviewed by me and considered in my medical decision making (see chart for details).    MDM Rules/Calculators/A&P                         patient here for evaluation of gastrostomy tube just lodgment. Tube was replaced without difficulty in the department. Plan to discharge back to facility with routine care.  Final Clinical Impression(s) / ED Diagnoses Final diagnoses:  Dislodged gastrostomy tube    Rx / DC Orders ED Discharge Orders     None        Tilden Fossa, MD 01/29/21 1458

## 2021-01-29 NOTE — ED Notes (Signed)
[facility] called for transport.  

## 2021-03-08 ENCOUNTER — Ambulatory Visit (HOSPITAL_BASED_OUTPATIENT_CLINIC_OR_DEPARTMENT_OTHER): Payer: Medicare Other

## 2021-03-08 ENCOUNTER — Other Ambulatory Visit: Payer: Self-pay

## 2021-03-08 ENCOUNTER — Ambulatory Visit (INDEPENDENT_AMBULATORY_CARE_PROVIDER_SITE_OTHER): Payer: Medicare Other | Admitting: Cardiology

## 2021-03-08 ENCOUNTER — Encounter (HOSPITAL_BASED_OUTPATIENT_CLINIC_OR_DEPARTMENT_OTHER): Payer: Self-pay | Admitting: Cardiology

## 2021-03-08 VITALS — BP 110/54 | HR 66

## 2021-03-08 DIAGNOSIS — J9610 Chronic respiratory failure, unspecified whether with hypoxia or hypercapnia: Secondary | ICD-10-CM

## 2021-03-08 DIAGNOSIS — R001 Bradycardia, unspecified: Secondary | ICD-10-CM | POA: Diagnosis not present

## 2021-03-08 DIAGNOSIS — Z930 Tracheostomy status: Secondary | ICD-10-CM

## 2021-03-08 DIAGNOSIS — Z9911 Dependence on respirator [ventilator] status: Secondary | ICD-10-CM

## 2021-03-08 DIAGNOSIS — G8250 Quadriplegia, unspecified: Secondary | ICD-10-CM

## 2021-03-08 NOTE — Progress Notes (Signed)
Cardiology Office Note:    Date:  03/08/2021   ID:  [name], DOB Jul 05, 1961, MRN [id number]  PCP:  Zollie Scale, MD  Cardiologist:  Jodelle Red, MD  Referring MD: Harlen Labs, NP   CC: concern for bradycardia   History of Present Illness:    Pamela George is a 60 y.o. female with a hx of acute on chronic respiratory failure with hypoxia s/p tracheostomy and ventilator, chronic diastolic heart failure, morbid obesity, quadriplegia, history of PEA cardiac arrest who is seen as a new consult at the request of Harlen Labs, NP for the evaluation and management of bradycardia.  She has not been seen as an outpatient by cardiology. Last evaluation was while admitted in the ICU 04/2019. Dr. Tresa Endo saw her for bradycardia at that time, noted that it was related to respiratory events, no further evaluation planned.  Cardiovascular risk factors: She is accompanied by 2 paramedics for transport. She provides her own history. She does not have any specific concerns regarding her heart. When asked about slow heart rates, she denies being aware of them but notes that the alarm on her monitor goes off. This makes her heart rate speed up.  She states it has been a while since the alarms sounded. However, she notes her oxygen levels have been dropping intermittently and has some concerns about her breathing. She has a chronic tracheostomy and is connected to oxygen during the day and a ventilator at night.  Also, she has myalgias mostly in her shoulders, knees, and back.  After discussion, she is amenable to wearing a heart monitor.  She denies any chest pain, lightheadedness, headaches, syncope, orthopnea, or PND. Also has no lower extremity edema or exertional symptoms.   Past Medical History:  Diagnosis Date   Acute on chronic respiratory failure with hypoxia (HCC)    Chronic diastolic heart failure (HCC)    History of diastolic dysfunction    Morbid  obesity (HCC)    Obstructive sleep apnea    Quadriplegia and quadriparesis (HCC)     Past Surgical History:  Procedure Laterality Date   PEG TUBE PLACEMENT     TRACHEOSTOMY      Current Medications: Current Outpatient Medications on File Prior to Visit  Medication Sig   acetaminophen (TYLENOL) 325 MG tablet Place 650 mg into feeding tube every 6 (six) hours as needed for mild pain.    ALPRAZolam (XANAX) 0.25 MG tablet Place 0.25 mg into feeding tube 2 (two) times daily as needed for anxiety.   Amino Acids-Protein Hydrolys (FEEDING SUPPLEMENT, PRO-STAT SUGAR FREE 64,) LIQD Place 30 mLs into feeding tube 2 (two) times daily.   baclofen (LIORESAL) 10 MG tablet Place 10 mg into feeding tube every 12 (twelve) hours.   chlorhexidine (PERIDEX) 0.12 % solution Use as directed 5 mLs in the mouth or throat 2 (two) times daily.   Cholecalciferol (VITAMIN D3) 50 MCG (2000 UT) TABS Take 2,000 Units by mouth daily. Per Tube   docusate sodium (STOOL SOFTENER) 100 MG capsule Take 100 mg by mouth daily. Per tube   Emollient (EUCERIN INTENSIVE REPAIR HAND EX) Apply topically.   enoxaparin (LOVENOX) 40 MG/0.4ML injection Inject 40 mg into the skin daily.   gabapentin (NEURONTIN) 300 MG capsule Place 300 mg into feeding tube every 6 (six) hours.   lactase (LACTAID) 3000 units tablet 3,000 Units daily as needed (STOMACH UPSET). PER TUBE   Melatonin 3 MG TABS Place 3 mg into feeding tube at bedtime.  methadone (DOLOPHINE) 10 MG tablet Take 10 mg by mouth daily.   nutrition supplement, JUVEN, (JUVEN) PACK Place 1 packet into feeding tube 2 (two) times daily between meals.   ondansetron (ZOFRAN) 4 MG tablet Place 4 mg into feeding tube every 6 (six) hours as needed for nausea or vomiting.   Oxycodone HCl 10 MG TABS Place 10 mg into feeding tube every 6 (six) hours as needed (PAIN).   potassium chloride 20 MEQ/15ML (10%) SOLN Place 30 mLs (40 mEq total) into feeding tube daily.   sennosides-docusate sodium  (SENOKOT-S) 8.6-50 MG tablet Place 1 tablet into feeding tube 2 (two) times daily as needed for constipation.   sertraline (ZOLOFT) 100 MG tablet Place 100 mg into feeding tube at bedtime.   sodium bicarbonate 650 MG tablet Place 650 mg into feeding tube daily as needed for heartburn.   Water For Irrigation, Sterile (FREE WATER) SOLN Place 200 mLs into feeding tube every 6 (six) hours.   atorvastatin (LIPITOR) 40 MG tablet Place 40 mg into feeding tube at bedtime. (Patient not taking: Reported on 03/08/2021)   bisacodyl (DULCOLAX) 5 MG EC tablet 10 mg 2 (two) times daily. PER TUBE   ceFEPime (MAXIPIME) IVPB Inject 1 g into the vein every 8 (eight) hours. (Patient not taking: Reported on 03/08/2021)   Cranberry 450 MG CAPS Place 450 mg into feeding tube daily. (Patient not taking: Reported on 03/08/2021)   No current facility-administered medications on file prior to visit.     Allergies:   Penicillins and Piperacillin-tazobactam in dex   Social History   Tobacco Use   Smoking status: Never   Smokeless tobacco: Never  Substance Use Topics   Alcohol use: Not Currently   Drug use: Never    Family History: family history includes Hypertension in her father and mother.  ROS:   Please see the history of present illness.  Additional pertinent ROS: Constitutional: Negative for chills, fever, night sweats, unintentional weight loss  HENT: Negative for ear pain and hearing loss.   Eyes: Negative for loss of vision and eye pain.  Respiratory: Negative for cough, sputum, wheezing.   Cardiovascular: See HPI. Gastrointestinal: Negative for abdominal pain, melena, and hematochezia.  Genitourinary: Negative for dysuria and hematuria.  Musculoskeletal: Myalgias mostly in shoulders, knees, and back. Negative for falls.  Skin: Negative for itching and rash.  Neurological: Negative for focal weakness, focal sensory changes and loss of consciousness.  Endo/Heme/Allergies: Does not bruise/bleed easily.      EKGs/Labs/Other Studies Reviewed:    The following studies were reviewed today:  Echo 05/14/2019:  1. Left ventricular ejection fraction, by visual estimation, is 55 to  60%. The left ventricle has normal function. There is no left ventricular  hypertrophy.   2. Left ventricular diastolic Doppler parameters are consistent with  pseudonormalization pattern of LV diastolic filling.   3. Global right ventricle has normal systolic function.The right  ventricular size is normal. No increase in right ventricular wall  thickness.   4. Left atrial size was normal.   5. Right atrial size was normal.   6. The mitral valve is normal in structure. Mild mitral valve  regurgitation.   7. The tricuspid valve is normal in structure. Tricuspid valve  regurgitation is trivial.   8. The aortic valve is normal in structure. Aortic valve regurgitation is  trivial by color flow Doppler.   9. The pulmonic valve was normal in structure. Pulmonic valve  regurgitation is trivial by color flow Doppler.  10. The atrial septum is grossly normal.   From Care Everywhere, [facility]18[facility]2018: Cardiac event monitor  Indication - survivor of  PEA cardiac arrest  Enrollment dates - 05/26/2018 -06/09/2018  Findings  No urgent or critical notifications  Patient triggers = none  Diary entries = none  Baseline mechanism is sinus rhythm, with an average heart rate of 73 beats per minute  Normal PR interval, narrow QRS morphology, no high-grade AV block or pauses  Atrial fibrillation - none  Ventricular ectopy - mild, predominantly in the form of unifocal PVCs, (1.4%), rare ventricular couplets, rare ventricular bigeminy and trigeminy; one episode of nonsustained wide-complex tachycardia, at 6:30 PM on 06/07/2018, heart rate varying between 102 -146 beats per minute  Supraventricular ectopy - mild, where premature atrial complexes, there were 7 runs of supraventricular tachycardia,  longest and fastest was a 14 beat run with an average heart rate of 164 beats per minute   Conclusions  Normal sinus rhythm, with an average heart rate of 73bpm, mild ventricular and supraventricular ectopy that included, one episode of nonsustained wide complex tachycardia as noted above   2 day regadenoson stress myocardial perfusion test, [facility], 01/12/2018 unable to be done due to respiratory distress   EKG:  EKG is personally reviewed.   03/08/2021: NSR at 66 bpm, poor R wave progression  Recent Labs: 04/16/2020: BUN 21; Creatinine, Ser 0.33; Hemoglobin 12.8; Platelets 205; Potassium 3.7; Sodium 141  Recent Lipid Panel    Component Value Date/Time   CHOL 71 05/14/2019 1454   TRIG 49 05/14/2019 1454   HDL 28 (L) 05/14/2019 1454   CHOLHDL 2.5 05/14/2019 1454   VLDL 10 05/14/2019 1454   LDLCALC 33 05/14/2019 1454    Physical Exam:    VS:  BP (!) 110/54   Pulse 66   SpO2 97%    Unable to obtain weight today as she is in stretcher without mobility  Wt Readings from Last 3 Encounters:  05/14/19 167 lb 5.3 oz (75.9 kg)  12/18/18 155 lb (70.3 kg)    GEN: lying in stretcher, neck brace and trach tubing in place. HEENT: Normal, moist mucous membranes NECK: neck brace in place, cannot assess CARDIAC: regular rhythm, normal S1 and S2, no rubs or gallops. No murmur. VASCULAR: Radial and DP pulses 2+ bilaterally RESPIRATORY:  Clear to auscultation anteriorly without rales, wheezing or rhonchi  ABDOMEN: Soft, non-tender, non-distended SKIN: Warm and dry, no edema NEUROLOGIC:  Alert and oriented x 3. Quadriparesis PSYCHIATRIC:  Normal affect    ASSESSMENT:    1. Bradycardia   2. Quadriplegia and quadriparesis (HCC)   3. Tracheostomy dependent (HCC)   4. Chronic respiratory failure requiring use of nocturnal mechanical ventilation through tracheostomy (HCC)    PLAN:    Bradycardia: -normal sinus rhythm on ECG -previously noted in 2020 while admitted, noted to be related to  respiratory status, no further evaluation recommended -she is asymptomatic but notes that the alarms for low heart rate sometimes go off. This makes her anxious, and her heart rate goes up in response -no loss of consciousness -[facility] -we will place 2 week Zio monitor today while she is in the office. This will just need to be mailed back to the company after two weeks -she is on no AV nodal agents, but methadone can cause bradycardia. If significant bradycardia/pauses seen on monitor, would need to consider weaning off methadone and monitoring prior  to sending to EP -she would like to avoid surgeries/procedures if possible.  Complex chronic medical factors, including quadraparesis, chronic respiratory failure with tracheostomy and ventilator use at night -complicates clinical management, high level of complexity and medical decision making  Plan for follow up: TBD after testing.  Jodelle Red, MD, PhD, [facility]  [facility]    Medication Adjustments/Labs and Tests Ordered: Current medicines are reviewed at length with the patient today.  Concerns regarding medicines are outlined above.   Orders Placed This Encounter  Procedures   LONG TERM MONITOR (3-14 DAYS)   EKG 12-Lead    No orders of the defined types were placed in this encounter.  Patient Instructions  Medication Instructions:  Your Physician recommend you continue on your current medication as directed.    *If you need a refill on your cardiac medications before your next appointment, please call your pharmacy*   Lab Work: None ordered today   Testing/Procedures: Our physician has recommended that you wear an 14  DAY ZIO-PATCH monitor. The Zio patch cardiac monitor continuously records heart rhythm data for up to 14 days, this is for patients being evaluated for multiple types heart rhythms. For the first 24 hours post application, please  avoid getting the Zio monitor wet in the shower or by excessive sweating during exercise. After that, feel free to carry on with regular activities. Keep soaps and lotions away from the ZIO XT Patch.     Follow-Up: At [facility], you and your health needs are our priority.  As part of our continuing mission to provide you with exceptional heart care, we have created designated Provider Care Teams.  These Care Teams include your primary Cardiologist (physician) and Advanced Practice Providers (APPs -  Physician Assistants and Nurse Practitioners) who all work together to provide you with the care you need, when you need it.  We recommend signing up for the patient portal called "MyChart".  Sign up information is provided on this After Visit Summary.  MyChart is used to connect with patients for Virtual Visits (Telemedicine).  Patients are able to view lab/test results, encounter notes, upcoming appointments, etc.  Non-urgent messages can be sent to your provider as well.   To learn more about what you can do with MyChart, go to [email or web address].    Your next appointment:   As needed  The format for your next appointment:   In Person  Provider:   Jodelle Red, MD  Christena Deem- Long Term Monitor Instructions  Your physician has requested you wear a ZIO patch monitor for 14 days.  This is a single patch monitor. [employer] supplies one patch monitor per enrollment. Additional stickers are not available. Please do not apply patch if you will be having a Nuclear Stress Test,  Echocardiogram, Cardiac CT, MRI, or Chest Xray during the period you would be wearing the  monitor. The patch cannot be worn during these tests. You cannot remove and re-apply the  ZIO XT patch monitor.  Your ZIO patch monitor will be mailed 3 day USPS to your address on file. It may take 3-5 days  to receive your monitor after you have been enrolled.  Once you have received your monitor, please review the  enclosed instructions. Your monitor  has already been registered assigning a specific monitor serial # to you.  Billing and Patient Assistance Program Information  We have supplied [employer] with any of your insurance information on file for billing purposes. [employer] offers a sliding  scale Patient Assistance Program for patients that do not have  insurance, or whose insurance does not completely cover the cost of the ZIO monitor.  You must apply for the Patient Assistance Program to qualify for this discounted rate.  To apply, please call [employer] at [phone], select option 4, select option 2, ask to apply for  Patient Assistance Program. Meredeth Ide will ask your household income, and how many people  are in your household. They will quote your out-of-pocket cost based on that information.  [employer] will also be able to set up a 75-month, interest-free payment plan if needed.  Applying the monitor   Shave hair from upper left chest.  Hold abrader disc by orange tab. Rub abrader in 40 strokes over the upper left chest as  indicated in your monitor instructions.  Clean area with 4 enclosed alcohol pads. Let dry.  Apply patch as indicated in monitor instructions. Patch will be placed under collarbone on left  side of chest with arrow pointing upward.  Rub patch adhesive wings for 2 minutes. Remove white label marked "1". Remove the white  label marked "2". Rub patch adhesive wings for 2 additional minutes.  While looking in a mirror, press and release button in center of patch. A small green light will  flash 3-4 times. This will be your only indicator that the monitor has been turned on.  Do not shower for the first 24 hours. You may shower after the first 24 hours.  Press the button if you feel a symptom. You will hear a small click. Record Date, Time and  Symptom in the Patient Logbook.  When you are ready to remove the patch, follow instructions on the last 2 pages of Patient  Logbook.  Stick patch monitor onto the last page of Patient Logbook.  Place Patient Logbook in the blue and white box. Use locking tab on box and tape box closed  securely. The blue and white box has prepaid postage on it. Please place it in the mailbox as  soon as possible. Your physician should have your test results approximately 7 days after the  monitor has been mailed back to [facility].  Call [facility] Customer Care at [phone] if you have questions regarding  your ZIO XT patch monitor. Call them immediately if you see an orange light blinking on your  monitor.  If your monitor falls off in less than 4 days, contact our Monitor department at [phone].  If your monitor becomes loose or falls off after 4 days call [employer] at [phone] for  suggestions on securing your monitor    I,Mathew Stumpf,acting as a scribe for Jodelle Red, MD.,have documented all relevant documentation on the behalf of Jodelle Red, MD,as directed by  Jodelle Red, MD while in the presence of Jodelle Red, MD.  I, Jodelle Red, MD, have reviewed all documentation for this visit. The documentation on 03/08/21 for the exam, diagnosis, procedures, and orders are all accurate and complete.   Signed, Jodelle Red, MD PhD 03/08/2021 12:57 PM    [facility]

## 2021-03-08 NOTE — Patient Instructions (Addendum)
Medication Instructions:  Your Physician recommend you continue on your current medication as directed.    *If you need a refill on your cardiac medications before your next appointment, please call your pharmacy*   Lab Work: None ordered today   Testing/Procedures: Our physician has recommended that you wear an 14  DAY ZIO-PATCH monitor. The Zio patch cardiac monitor continuously records heart rhythm data for up to 14 days, this is for patients being evaluated for multiple types heart rhythms. For the first 24 hours post application, please avoid getting the Zio monitor wet in the shower or by excessive sweating during exercise. After that, feel free to carry on with regular activities. Keep soaps and lotions away from the ZIO XT Patch.      Follow-Up: At [facility], you and your health needs are our priority.  As part of our continuing mission to provide you with exceptional heart care, we have created designated Provider Care Teams.  These Care Teams include your primary Cardiologist (physician) and Advanced Practice Providers (APPs -  Physician Assistants and Nurse Practitioners) who all work together to provide you with the care you need, when you need it.  We recommend signing up for the patient portal called "MyChart".  Sign up information is provided on this After Visit Summary.  MyChart is used to connect with patients for Virtual Visits (Telemedicine).  Patients are able to view lab/test results, encounter notes, upcoming appointments, etc.  Non-urgent messages can be sent to your provider as well.   To learn more about what you can do with MyChart, go to [email or web address].    Your next appointment:   As needed  The format for your next appointment:   In Person  Provider:   Bridgette Christopher, MD   ZIO XT- Long Term Monitor Instructions  Your physician has requested you wear a ZIO patch monitor for 14 days.  This is a single patch monitor. [employer] supplies one  patch monitor per enrollment. Additional stickers are not available. Please do not apply patch if you will be having a Nuclear Stress Test,  Echocardiogram, Cardiac CT, MRI, or Chest Xray during the period you would be wearing the  monitor. The patch cannot be worn during these tests. You cannot remove and re-apply the  ZIO XT patch monitor.  Your ZIO patch monitor will be mailed 3 day USPS to your address on file. It may take 3-5 days  to receive your monitor after you have been enrolled.  Once you have received your monitor, please review the enclosed instructions. Your monitor  has already been registered assigning a specific monitor serial # to you.  Billing and Patient Assistance Program Information  We have supplied [employer] with any of your insurance information on file for billing purposes. [employer] offers a sliding scale Patient Assistance Program for patients that do not have  insurance, or whose insurance does not completely cover the cost of the ZIO monitor.  You must apply for the Patient Assistance Program to qualify for this discounted rate.  To apply, please call [employer] at [phone], select option 4, select option 2, ask to apply for  Patient Assistance Program. [employer] will ask your household income, and how many people  are in your household. They will quote your out-of-pocket cost based on that information.  [employer] will also be able to set up a 12-month, interest-free payment plan if needed.  Applying the monitor   Shave hair from upper left chest.  Hold abrader disc by   orange tab. Rub abrader in 40 strokes over the upper left chest as  indicated in your monitor instructions.  Clean area with 4 enclosed alcohol pads. Let dry.  Apply patch as indicated in monitor instructions. Patch will be placed under collarbone on left  side of chest with arrow pointing upward.  Rub patch adhesive wings for 2 minutes. Remove white label marked "1". Remove the white  label marked  "2". Rub patch adhesive wings for 2 additional minutes.  While looking in a mirror, press and release button in center of patch. A small green light will  flash 3-4 times. This will be your only indicator that the monitor has been turned on.  Do not shower for the first 24 hours. You may shower after the first 24 hours.  Press the button if you feel a symptom. You will hear a small click. Record Date, Time and  Symptom in the Patient Logbook.  When you are ready to remove the patch, follow instructions on the last 2 pages of Patient  Logbook. Stick patch monitor onto the last page of Patient Logbook.  Place Patient Logbook in the blue and white box. Use locking tab on box and tape box closed  securely. The blue and white box has prepaid postage on it. Please place it in the mailbox as  soon as possible. Your physician should have your test results approximately 7 days after the  monitor has been mailed back to [employer].  Call [employer] Customer Care at [phone] if you have questions regarding  your ZIO XT patch monitor. Call them immediately if you see an orange light blinking on your  monitor.  If your monitor falls off in less than 4 days, contact our Monitor department at [phone].  If your monitor becomes loose or falls off after 4 days call [employer] at [phone] for  suggestions on securing your monitor   

## 2021-04-10 ENCOUNTER — Telehealth (HOSPITAL_BASED_OUTPATIENT_CLINIC_OR_DEPARTMENT_OTHER): Payer: Self-pay | Admitting: Cardiology

## 2021-04-10 NOTE — Telephone Encounter (Signed)
[facility] concerning pt monitor. ZIO Suite has monitor status as "Pending Return." Spoke with Pamela George, Facilities manager (Direct ext: [phone]) who stated they threw the monitor out because "they were given no instructions at all." She stated they took the monitor off on 03/22/21 and thought it was a LIVE monitor that would be monitoring her the whole time. I informed her it was not live and all ZIO monitors are supposed to be mailed back.  Pamela, Nurse Manager stated pt has not had anymore bradycardic episodes. She stated to call and let her know if another appointment will be needed.   Informed her I will let Dr. Cristal Deer and Z[employer] know about the monitor and will call back if needed.  Routing to Dr. Cristal Deer as Lorain Childes.

## 2021-04-10 NOTE — Telephone Encounter (Signed)
Thank you. It is unfortunate that the monitor was thrown away. At this time, if she has not had further events, I would not try to repeat the monitor. She can follow up with [place] as needed.

## 2022-07-06 IMAGING — MR MR CERVICAL SPINE W/O CM
6 series · 38 of 48 positions shown · non-contrast
Comparison: None.

CLINICAL DATA: Quadriplegia

EXAM:
MRI CERVICAL SPINE WITHOUT CONTRAST
TECHNIQUE: Multiplanar, multisequence MR imaging of the cervical spine was
performed. No intravenous contrast was administered.

[Series 6: T2 · sagittal · 3.0mm · 0.69mm/px · 5 of 15 slices shown (1 of 3)]
[im 1/15]
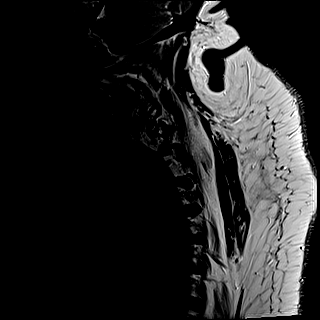
[im 4/15]
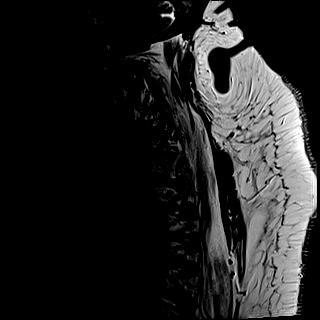
[im 8/15]
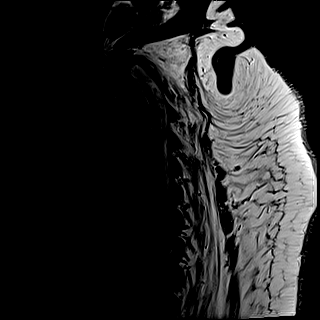
[im 11/15]
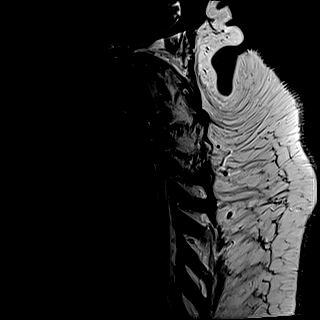
[im 15/15]
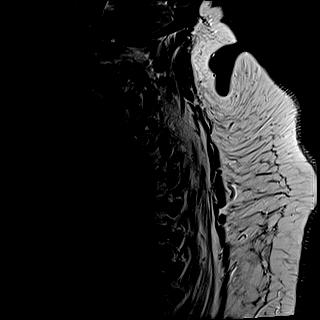

[Series 7: T1 · sagittal · 3.0mm · 0.69mm/px · 5 of 15 slices shown]
[im 1/15]
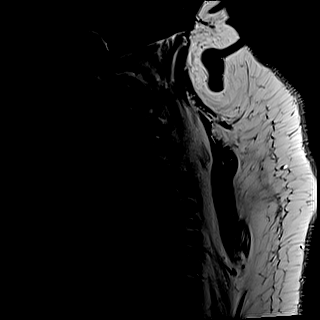
[im 4/15]
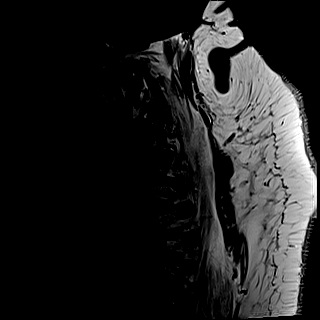
[im 8/15]
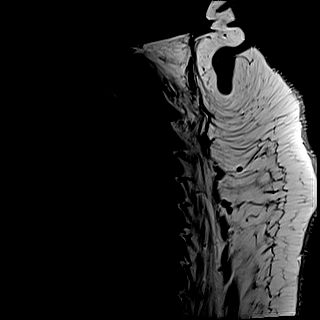
[im 11/15]
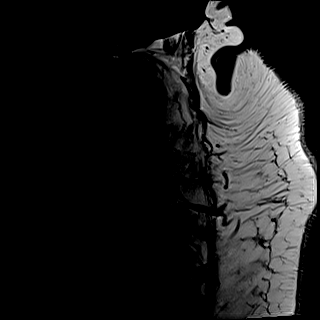
[im 15/15]
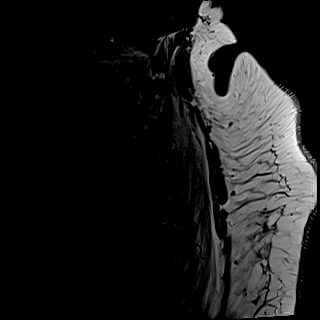

[Series 8: STIR · sagittal · 3.0mm · 0.86mm/px · 5 of 15 slices shown]
[im 1/15]
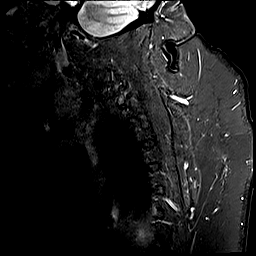
[im 4/15]
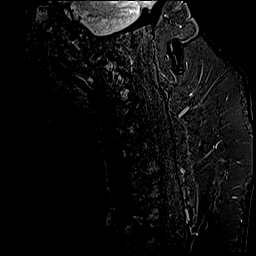
[im 8/15]
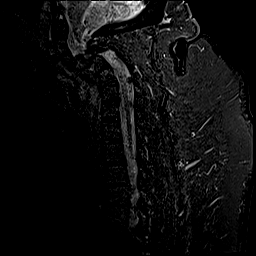
[im 11/15]
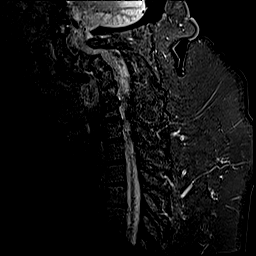
[im 15/15]
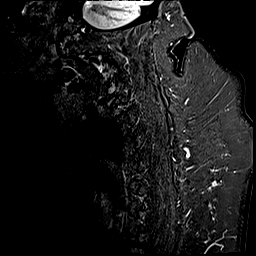

[Series 9: T2 · axial · 3.0mm · 0.70mm/px · z∈[-74,+64]mm · 10 of 41 slices shown (2 of 3)]
[im 1/41]
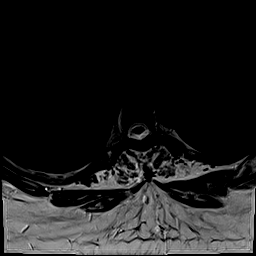
[im 4/41]
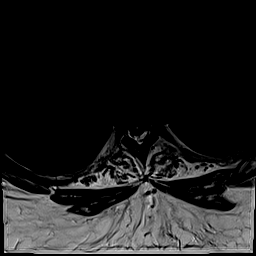
[im 7/41]
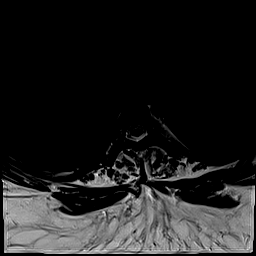
[im 10/41]
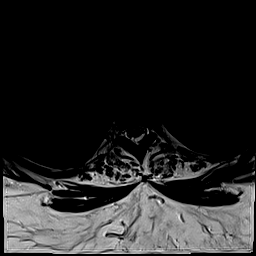
[im 13/41]
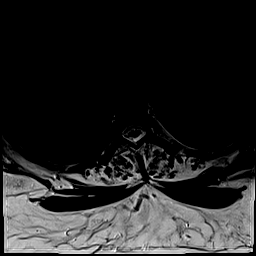
[im 19/41]
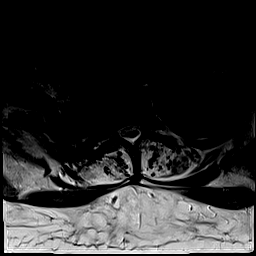
[im 22/41]
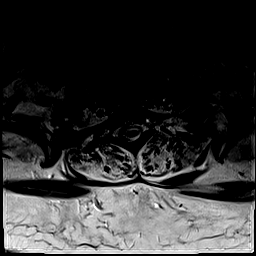
[im 28/41]
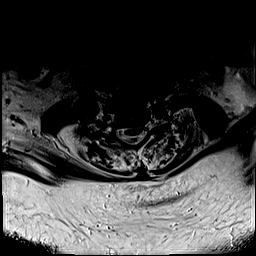
[im 34/41]
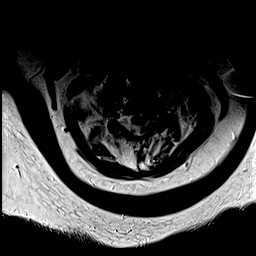
[im 41/41]
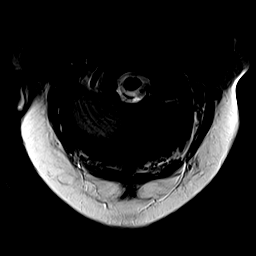

[Series 10: GRE · axial · 3.0mm · 0.35mm/px · z∈[-74,+64]mm · 8 of 41 slices shown]
[im 1/41]
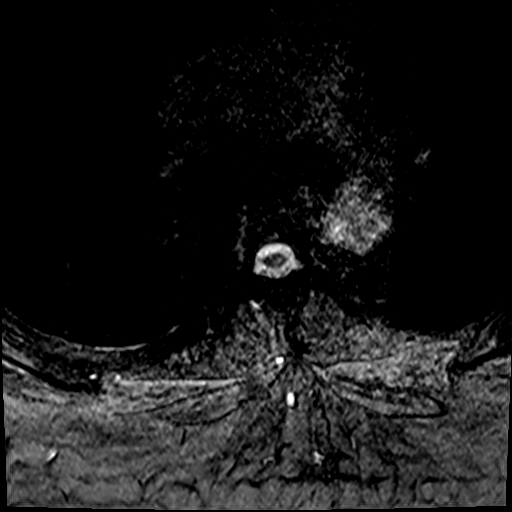
[im 7/41]
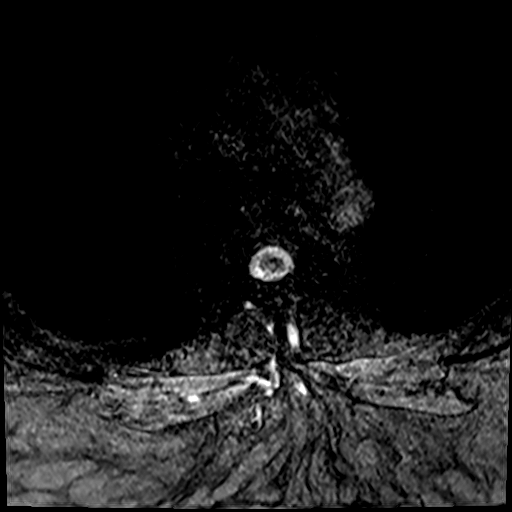
[im 13/41]
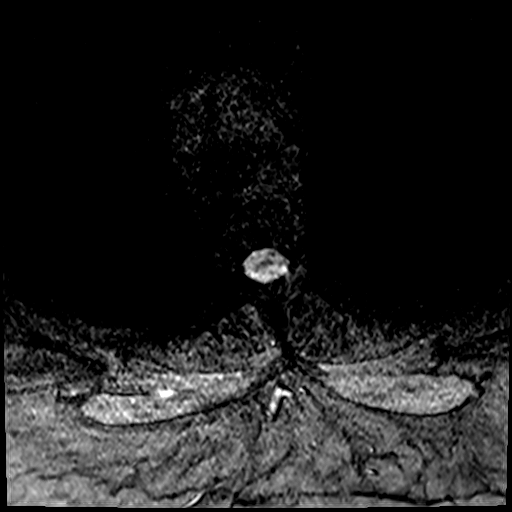
[im 19/41]
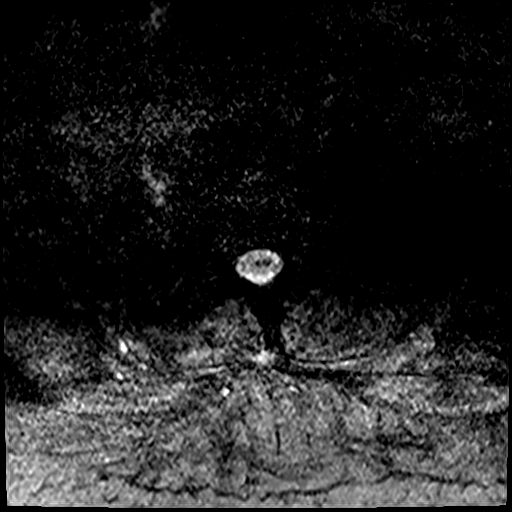
[im 22/41]
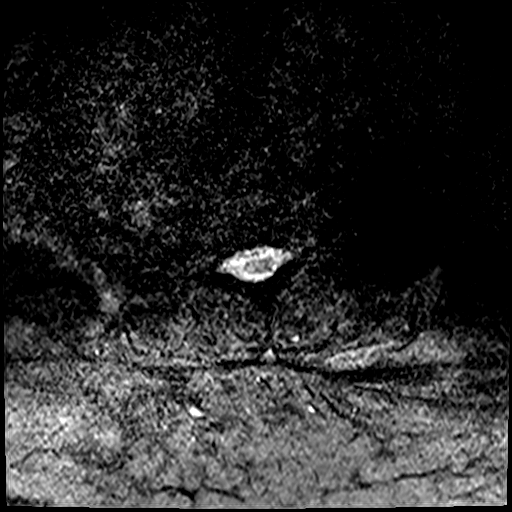
[im 28/41]
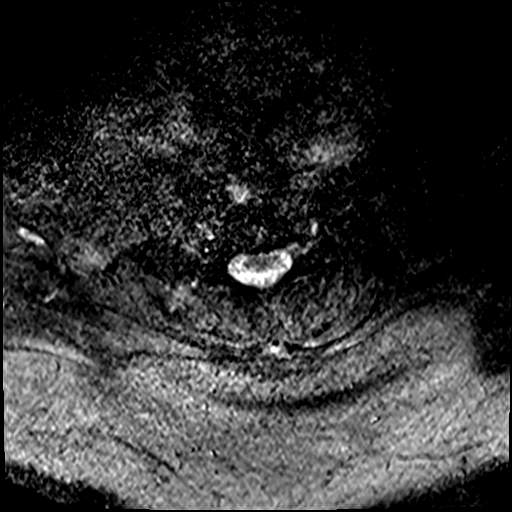
[im 34/41]
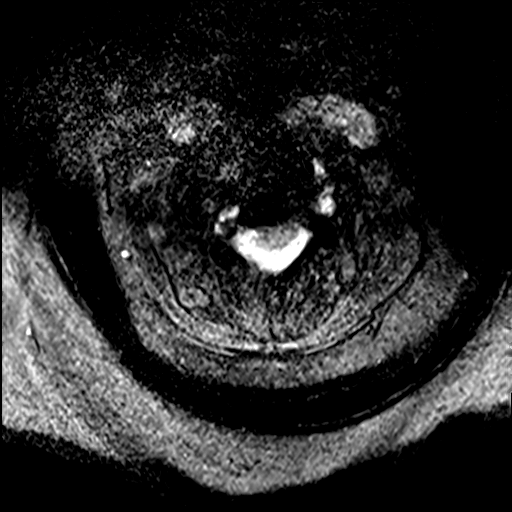
[im 41/41]
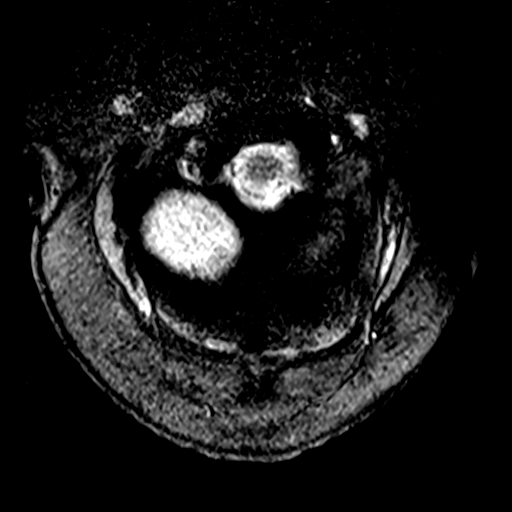

[Series 11: T2 · sagittal · 3.0mm · 0.69mm/px · 5 of 15 slices shown (3 of 3)]
[im 1/15]
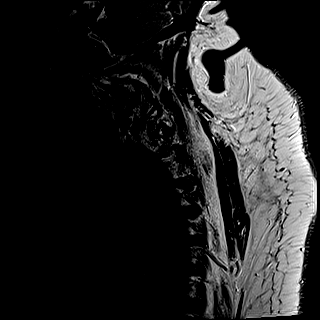
[im 4/15]
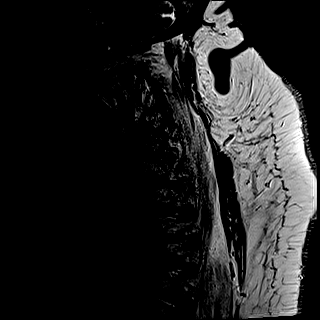
[im 8/15]
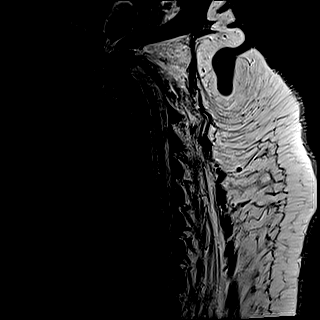
[im 11/15]
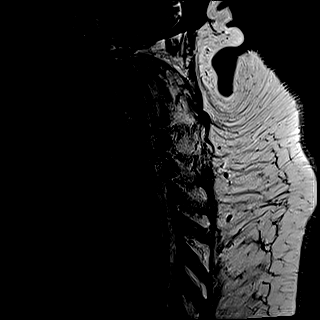
[im 15/15]
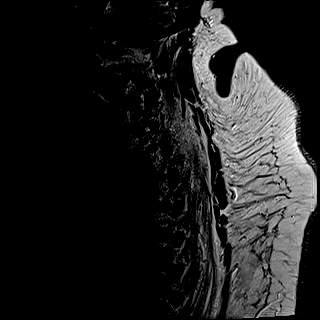

[38 of 48 positions shown; findings below may reference images not displayed]

FINDINGS: Alignment: Grade [DATE] anterolisthesis at the C3-4 level of 4 mm.
Grade 2 C4-5 anterolisthesis of 6 mm. Focal kyphosis at the C4-5
level. Trace C5-6 anterolisthesis.

Vertebrae: Modic type 2 endplate degenerative changes. No acute
fracture. No aggressive osseous lesion.

Cord: Normal cord morphology from the C5-T7 levels. Mild T2
hyperintense signal of the cord at the C2-4 levels. The cord
demonstrates loss of distinctness on axial images at these levels.

Posterior Fossa, vertebral arteries: Negative.

Disc levels: Multilevel desiccation.

C2-3: No significant disc bulge. Bilateral facet degenerative
spurring. Patent spinal canal and neural foramen.

C3-4: Grade [DATE] anterolisthesis. No significant disc bulge. Patent
spinal canal. Limited visualization of the neural foramen.

C4-5: Grade 2 anterolisthesis. Mild-to-moderate narrowing of the
spinal canal. Limited visualization of the neural foramen.

C5-6: Bilateral facet hypertrophy. Shallow right foraminal
protrusion. Patent spinal canal and neural foramen.

C6-7: Uncovertebral and facet degenerative spurring. Minimal disc
bulge. Patent spinal canal and neural foramen.

C7-T1: Uncovertebral and facet degenerative spurring. Minimal disc
bulge. Patent spinal canal and neural foramen.

Paraspinal tissues: Indwelling tracheostomy.
IMPRESSION: Mild T2 hyperintense signal and loss of distinctness involving the
cervical cord at the C2-4 levels may reflect myelomalacia/chronic
posttraumatic sequela.

Grade 1-2 anterolisthesis at the C3-5 levels with focal kyphosis,
likely chronic posttraumatic sequela.

## 2022-07-21 ENCOUNTER — Emergency Department (HOSPITAL_COMMUNITY): Payer: Medicare Other

## 2022-07-21 ENCOUNTER — Emergency Department (HOSPITAL_COMMUNITY)
Admission: EM | Admit: 2022-07-21 | Discharge: 2022-07-22 | Disposition: A | Discharge status: Home / Self Care | Payer: Medicare Other | Attending: Emergency Medicine | Admitting: Emergency Medicine

## 2022-07-21 ENCOUNTER — Encounter (HOSPITAL_COMMUNITY): Payer: Self-pay

## 2022-07-21 ENCOUNTER — Other Ambulatory Visit: Payer: Self-pay

## 2022-07-21 DIAGNOSIS — Z9911 Dependence on respirator [ventilator] status: Secondary | ICD-10-CM | POA: Diagnosis not present

## 2022-07-21 DIAGNOSIS — J9610 Chronic respiratory failure, unspecified whether with hypoxia or hypercapnia: Secondary | ICD-10-CM | POA: Diagnosis not present

## 2022-07-21 DIAGNOSIS — I5032 Chronic diastolic (congestive) heart failure: Secondary | ICD-10-CM | POA: Diagnosis not present

## 2022-07-21 DIAGNOSIS — J189 Pneumonia, unspecified organism: Secondary | ICD-10-CM | POA: Insufficient documentation

## 2022-07-21 DIAGNOSIS — Z930 Tracheostomy status: Secondary | ICD-10-CM | POA: Insufficient documentation

## 2022-07-21 DIAGNOSIS — G8250 Quadriplegia, unspecified: Secondary | ICD-10-CM | POA: Diagnosis not present

## 2022-07-21 DIAGNOSIS — Z6830 Body mass index (BMI) 30.0-30.9, adult: Secondary | ICD-10-CM | POA: Insufficient documentation

## 2022-07-21 DIAGNOSIS — Z79899 Other long term (current) drug therapy: Secondary | ICD-10-CM | POA: Insufficient documentation

## 2022-07-21 DIAGNOSIS — G4733 Obstructive sleep apnea (adult) (pediatric): Secondary | ICD-10-CM | POA: Diagnosis not present

## 2022-07-21 DIAGNOSIS — R059 Cough, unspecified: Secondary | ICD-10-CM | POA: Diagnosis present

## 2022-07-21 NOTE — Discharge Instructions (Signed)
It was a pleasure caring for you today in the emergency department. ° °Please return to the emergency department for any worsening or worrisome symptoms. ° ° °

## 2022-07-21 NOTE — ED Triage Notes (Signed)
Pt BIB [facility] from [facility] d/t 103 oral Temp. Pt is vent dependent, with a 6XLT Shiley, has been recently Tx for PNA, but today his access infiltrated causing issues with receiving his ABT. A/Ox4 & does have a PEG tube. VSS otherwise.

## 2022-07-21 NOTE — ED Provider Notes (Signed)
[facility] EMERGENCY DEPARTMENT Provider Note   CSN: [id number] Arrival date & time: 07/21/22  2108     History {Add pertinent medical, surgical, social history, OB history to HPI:1} Chief Complaint  Patient presents with   Fever   Vent/Trach    Pamela George is a 61 y.o. female.  Patient as above with significant medical history as below, including quadraplegia, obesity, osa, chronic respiratory failure, vent/trach dependent who presents to the ED with complaint of iv access problem, pna. Pt from [facility] She is being treated for PNA at facility, apparently they lost IV access at some point yesterday and were unable to get another IV today, sent to ED for eval She is HDS Pt unable to provide any history      Past Medical History:  Diagnosis Date   Acute on chronic respiratory failure with hypoxia (HCC)    Chronic diastolic heart failure (HCC)    History of diastolic dysfunction    Morbid obesity (HCC)    Obstructive sleep apnea    Quadriplegia and quadriparesis (HCC)     Past Surgical History:  Procedure Laterality Date   PEG TUBE PLACEMENT     TRACHEOSTOMY       The history is provided by the EMS personnel. The history is limited by the condition of the patient. No language interpreter was used.  Fever      Home Medications Prior to Admission medications   Medication Sig Start Date End Date Taking? Authorizing Provider  acetaminophen (TYLENOL) 325 MG tablet Place 650 mg into feeding tube every 6 (six) hours as needed for mild pain.     [provider]  ALPRAZolam Prudy Feeler) 0.25 MG tablet Place 0.25 mg into feeding tube 2 (two) times daily as needed for anxiety.    [provider]  Amino Acids-Protein Hydrolys (FEEDING SUPPLEMENT, PRO-STAT SUGAR FREE 64,) LIQD Place 30 mLs into feeding tube 2 (two) times daily.    [provider]  atorvastatin (LIPITOR) 40 MG tablet Place 40 mg into feeding tube at  bedtime. Patient not taking: Reported on 03/08/2021    [provider]  baclofen (LIORESAL) 10 MG tablet Place 10 mg into feeding tube every 12 (twelve) hours.    [provider]  bisacodyl (DULCOLAX) 5 MG EC tablet 10 mg 2 (two) times daily. PER TUBE    [provider]  ceFEPime (MAXIPIME) IVPB Inject 1 g into the vein every 8 (eight) hours. Patient not taking: Reported on 03/08/2021    [provider]  chlorhexidine (PERIDEX) 0.12 % solution Use as directed 5 mLs in the mouth or throat 2 (two) times daily.    [provider]  Cholecalciferol (VITAMIN D3) 50 MCG (2000 UT) TABS Take 2,000 Units by mouth daily. Per Tube    [provider]  Cranberry 450 MG CAPS Place 450 mg into feeding tube daily. Patient not taking: Reported on 03/08/2021    [provider]  docusate sodium (STOOL SOFTENER) 100 MG capsule Take 100 mg by mouth daily. Per tube    [provider]  Emollient (EUCERIN INTENSIVE REPAIR HAND EX) Apply topically.    [provider]  enoxaparin (LOVENOX) 40 MG/0.4ML injection Inject 40 mg into the skin daily.    [provider]  gabapentin (NEURONTIN) 300 MG capsule Place 300 mg into feeding tube every 6 (six) hours.    [provider]  lactase (LACTAID) 3000 units tablet 3,000 Units daily as needed (STOMACH UPSET).  PER TUBE    [provider]  Melatonin 3 MG TABS Place 3 mg into feeding tube at bedtime.    [provider]  methadone (DOLOPHINE) 10 MG tablet Take 10 mg by mouth daily. 03/17/20   [provider]  nutrition supplement, JUVEN, (JUVEN) PACK Place 1 packet into feeding tube 2 (two) times daily between meals.    [provider]  ondansetron (ZOFRAN) 4 MG tablet Place 4 mg into feeding tube every 6 (six) hours as needed for nausea or vomiting.    [provider]  Oxycodone HCl 10 MG TABS Place 10 mg into feeding tube every 6 (six) hours as  needed (PAIN).    [provider]  potassium chloride 20 MEQ/15ML (10%) SOLN Place 30 mLs (40 mEq total) into feeding tube daily. 05/14/19   Caro Laroche, DO  sennosides-docusate sodium (SENOKOT-S) 8.6-50 MG tablet Place 1 tablet into feeding tube 2 (two) times daily as needed for constipation.    [provider]  sertraline (ZOLOFT) 100 MG tablet Place 100 mg into feeding tube at bedtime.    [provider]  sodium bicarbonate 650 MG tablet Place 650 mg into feeding tube daily as needed for heartburn.    [provider]  Water For Irrigation, Sterile (FREE WATER) SOLN Place 200 mLs into feeding tube every 6 (six) hours. 05/14/19   Caro Laroche, DO      Allergies    Penicillins and Piperacillin-tazobactam in dex    Review of Systems   Review of Systems  Unable to perform ROS: Patient nonverbal  Constitutional:  Positive for fever.    Physical Exam Updated Vital Signs Temp (!) 100.8 F (38.2 C)  Physical Exam Vitals and nursing note reviewed. Exam conducted with a chaperone present.  Constitutional:      General: She is not in acute distress.    Appearance: Normal appearance. She is obese. She is not ill-appearing.  HENT:     Head: Normocephalic and atraumatic.      Right Ear: External ear normal.     Left Ear: External ear normal.     Nose: Nose normal.     Mouth/Throat:     Mouth: Mucous membranes are moist.  Eyes:     General: No scleral icterus.       Right eye: No discharge.        Left eye: No discharge.  Cardiovascular:     Rate and Rhythm: Normal rate and regular rhythm.     Pulses: Normal pulses.     Heart sounds: Normal heart sounds.  Pulmonary:     Effort: Pulmonary effort is normal. No respiratory distress.     Breath sounds: Normal breath sounds.  Abdominal:     General: Abdomen is flat.     Tenderness: There is no abdominal tenderness.    Musculoskeletal:        General: No deformity. Normal range of motion.   Skin:    General: Skin is warm and dry.     Capillary Refill: Capillary refill takes less than 2 seconds.     Coloration: Skin is not jaundiced.  Neurological:     Mental Status: She is alert.     Comments: She will make eye contact but nonverbal     ED Results / Procedures / Treatments   Labs (all labs ordered are listed, but only abnormal results are displayed) Labs Reviewed  CBC WITH DIFFERENTIAL/PLATELET  BASIC METABOLIC PANEL  LACTIC  ACID, PLASMA  LACTIC ACID, PLASMA    EKG None  Radiology No results found.  Procedures Procedures  {Document cardiac monitor, telemetry assessment procedure when appropriate:1}  Medications Ordered in ED Medications - No data to display  ED Course/ Medical Decision Making/ A&P                           Medical Decision Making Amount and/or Complexity of Data Reviewed Labs: ordered. Radiology: ordered.   Medical Decision Making:   Pamela George is a 61 y.o. female who presented to the ED today with vascular access problem, fever as  detailed above.    Handoff received from EMS.  External chart has been reviewed including prior ed visits, prior primary care documentation, prior labs/imaging/allergies/home meds. Patient's presentation is complicated by their history of quadriplegia, obesity, nursing home pt.  Patient placed on continuous vitals and telemetry monitoring while in ED which was reviewed periodically.  Complete initial physical exam performed, notably the patient  was febrile on arrival, on vent, no acute distress .    Reviewed and confirmed nursing documentation for past medical history, family history, social history.  Vital signs were reviewed.   Initial Assessment:   This patient presents to the ED with chief complaint(s) of *** with pertinent past medical history of *** which further complicates the presenting complaint. The complaint involves an extensive differential diagnosis and also carries with it a high  risk of complications and morbidity.  Serious etiology was considered. Ddx includes but is not limited to: *** {crccopa:27899}  Initial Plan:  ***  ***Screening labs including CBC and Metabolic panel to evaluate for infectious or metabolic etiology of disease.  ***Urinalysis with reflex culture ordered to evaluate for UTI or relevant urologic/nephrologic pathology.  ***CXR to evaluate for structural/infectious intrathoracic pathology.  ***EKG to evaluate for cardiac pathology Objective evaluation as below reviewed   Initial Study Results:   Laboratory  All laboratory results reviewed without evidence of clinically relevant pathology.   ***Exceptions include: ***   ***EKG EKG was reviewed independently.   Radiology:   I independently visualized the following imaging with scope of interpretation limited to determining acute life threatening conditions related to emergency care: ***, which revealed ***. I agree with radiologist interpretation.   No results found.  Cardiac monitoring was reviewed and interpreted by myself which shows ***    Consults: Case discussed with ***.   ED course:     Reassessment: ***  Final Assessment and Plan:   ***    Admission was considered    Clinical Impression: No diagnosis found.   Data Unavailable       Social Determinants of health: Social History   Tobacco Use   Smoking status: Never   Smokeless tobacco: Never  Substance Use Topics   Alcohol use: Not Currently   Drug use: Never                  This chart was dictated using voice recognition software.  Despite best efforts to proofread,  errors can occur which can change the documentation meaning.   {Document critical care time when appropriate:1} {Document review of labs and clinical decision tools ie heart score, Chads2Vasc2 etc:1}  {Document your independent review of radiology images, and any outside records:1} {Document your discussion with family  members, caretakers, and with consultants:1} {Document social determinants of health affecting pt's care:1} {Document your decision making why or why not admission, treatments  were needed:1} Final Clinical Impression(s) / ED Diagnoses Final diagnoses:  None    Rx / DC Orders ED Discharge Orders     None

## 2022-07-22 DIAGNOSIS — J189 Pneumonia, unspecified organism: Secondary | ICD-10-CM | POA: Diagnosis not present

## 2022-07-22 LAB — CBC WITH DIFFERENTIAL/PLATELET
Abs Immature Granulocytes: 0.08 10*3/uL — ABNORMAL HIGH (ref 0.00–0.07)
Basophils Absolute: 0.1 10*3/uL (ref 0.0–0.1)
Basophils Relative: 1 %
Eosinophils Absolute: 0.1 10*3/uL (ref 0.0–0.5)
Eosinophils Relative: 1 %
HCT: 35.5 % — ABNORMAL LOW (ref 36.0–46.0)
Hemoglobin: 10.1 g/dL — ABNORMAL LOW (ref 12.0–15.0)
Immature Granulocytes: 1 %
Lymphocytes Relative: 24 %
Lymphs Abs: 2.4 10*3/uL (ref 0.7–4.0)
MCH: 29.9 pg (ref 26.0–34.0)
MCHC: 28.5 g/dL — ABNORMAL LOW (ref 30.0–36.0)
MCV: 105 fL — ABNORMAL HIGH (ref 80.0–100.0)
Monocytes Absolute: 0.7 10*3/uL (ref 0.1–1.0)
Monocytes Relative: 7 %
Neutro Abs: 6.8 10*3/uL (ref 1.7–7.7)
Neutrophils Relative %: 66 %
Platelets: 177 10*3/uL (ref 150–400)
RBC: 3.38 MIL/uL — ABNORMAL LOW (ref 3.87–5.11)
RDW: 14.9 % (ref 11.5–15.5)
WBC: 10.1 10*3/uL (ref 4.0–10.5)
nRBC: 0 % (ref 0.0–0.2)

## 2022-07-22 LAB — BASIC METABOLIC PANEL
Anion gap: 11 (ref 5–15)
BUN: 22 mg/dL (ref 8–23)
CO2: 28 mmol/L (ref 22–32)
Calcium: 8.9 mg/dL (ref 8.9–10.3)
Chloride: 108 mmol/L (ref 98–111)
Creatinine, Ser: 0.61 mg/dL (ref 0.44–1.00)
GFR, Estimated: >60 mL/min (ref >60)
Glucose, Bld: 111 mg/dL — ABNORMAL HIGH (ref 70–99)
Potassium: 4.2 mmol/L (ref 3.5–5.1)
Sodium: 147 mmol/L — ABNORMAL HIGH (ref 135–145)

## 2022-07-22 LAB — LACTIC ACID, PLASMA: Lactic Acid, Venous: 1.1 mmol/L (ref 0.5–1.9)

## 2022-07-22 MED ORDER — SODIUM CHLORIDE 0.9% FLUSH: 10.0000 mL | INTRAVENOUS | Status: DC | PRN

## 2022-07-22 MED ORDER — CHLORHEXIDINE GLUCONATE CLOTH 2 % EX PADS: 6.0000 | MEDICATED_PAD | Freq: Every day | CUTANEOUS | Status: DC

## 2022-07-22 MED ORDER — SODIUM CHLORIDE 0.9% FLUSH: 10.0000 mL | Freq: Two times a day (BID) | INTRAVENOUS | Status: DC

## 2022-07-22 NOTE — ED Notes (Signed)
Update given to Joni Reining, RN at [facility] on plan for PICC p 0700 and then return after.

## 2022-07-22 NOTE — ED Notes (Signed)
Patient found to be saturated in both stool and urine. Patient rolled and cleaned. Patient placed in a clean gown with clean chux placed underneath.

## 2022-07-22 NOTE — Progress Notes (Signed)
Peripherally Inserted Central Catheter Placement  The IV Nurse has discussed with the patient and/or persons authorized to consent for the patient, the purpose of this procedure and the potential benefits and risks involved with this procedure.  The benefits include less needle sticks, lab draws from the catheter, and the patient may be discharged home with the catheter. Risks include, but not limited to, infection, bleeding, blood clot (thrombus formation), and puncture of an artery; nerve damage and irregular heartbeat and possibility to perform a PICC exchange if needed/ordered by physician.  Alternatives to this procedure were also discussed.  Bard Power PICC patient education guide, fact sheet on infection prevention and patient information card has been provided to patient /or left at bedside.    PICC Placement Documentation  PICC Single Lumen 07/22/22 Right Brachial 40 cm 0 cm (Active)  Indication for Insertion or Continuance of Line Prolonged intravenous therapies 07/22/22 1018  Exposed Catheter (cm) 0 cm 07/22/22 1018  Site Assessment Clean, Dry, Intact 07/22/22 1018  Line Status Flushed;Blood return noted;Saline locked 07/22/22 1018  Dressing Type Transparent 07/22/22 1018  Dressing Status Antimicrobial disc in place 07/22/22 1018  Safety Lock Not Applicable 07/22/22 1018  Line Care Connections checked and tightened 07/22/22 1018  Line Adjustment (NICU/IV Team Only) No 07/22/22 1018  Dressing Intervention New dressing 07/22/22 1018  Dressing Change Due 07/29/22 07/22/22 1018       [name] 07/22/2022, 10:21 AM

## 2022-07-22 NOTE — ED Provider Notes (Signed)
Patient signed out to me by Dr. Wallace Cullens.  Patient is vent dependent, currently residing at [facility] facility.  She was apparently being treated for pneumonia, lost her IV and was sent to the ED when they could not get access.  Patient has an IV now.  She was mildly febrile at arrival, otherwise vitals are normal.  Slight leukocytosis, remainder of lab work is unremarkable including normal lactic acid.  She will be appropriate for further management at [facility].   Gilda Crease, MD 07/22/22 [phone]

## 2022-07-22 NOTE — ED Notes (Signed)
Suctioned pt mouth to remove secretions. Attempted to suction trach, but unable to advance suction tubing far and did not get any secretions back. Notified RT.

## 2022-07-22 NOTE — ED Notes (Signed)
Gave report to [facility] staff and updated them that PICC line has been placed.

## 2022-07-22 NOTE — Progress Notes (Signed)
RT assisted with transport of pt from RES room in ED to room 10 in ED while on full ventialtor support. SVS and no complications.

## 2022-07-23 ENCOUNTER — Emergency Department: Payer: Self-pay

## 2022-07-30 DIAGNOSIS — F330 Major depressive disorder, recurrent, mild: Secondary | ICD-10-CM | POA: Diagnosis not present

## 2022-07-30 DIAGNOSIS — J9601 Acute respiratory failure with hypoxia: Secondary | ICD-10-CM | POA: Diagnosis not present

## 2022-07-30 DIAGNOSIS — G4733 Obstructive sleep apnea (adult) (pediatric): Secondary | ICD-10-CM | POA: Diagnosis not present

## 2022-07-30 DIAGNOSIS — J189 Pneumonia, unspecified organism: Secondary | ICD-10-CM

## 2022-07-30 DIAGNOSIS — R1310 Dysphagia, unspecified: Secondary | ICD-10-CM

## 2022-07-30 DIAGNOSIS — L89154 Pressure ulcer of sacral region, stage 4: Secondary | ICD-10-CM

## 2022-07-30 DIAGNOSIS — G8250 Quadriplegia, unspecified: Secondary | ICD-10-CM

## 2022-07-30 DIAGNOSIS — R5381 Other malaise: Secondary | ICD-10-CM

## 2022-08-13 DIAGNOSIS — R1310 Dysphagia, unspecified: Secondary | ICD-10-CM

## 2022-08-13 DIAGNOSIS — R5381 Other malaise: Secondary | ICD-10-CM

## 2022-08-13 DIAGNOSIS — N390 Urinary tract infection, site not specified: Secondary | ICD-10-CM

## 2022-08-13 DIAGNOSIS — G4733 Obstructive sleep apnea (adult) (pediatric): Secondary | ICD-10-CM

## 2022-08-13 DIAGNOSIS — G8250 Quadriplegia, unspecified: Secondary | ICD-10-CM

## 2022-08-13 DIAGNOSIS — L89154 Pressure ulcer of sacral region, stage 4: Secondary | ICD-10-CM

## 2022-08-13 DIAGNOSIS — J9601 Acute respiratory failure with hypoxia: Secondary | ICD-10-CM

## 2022-08-13 DIAGNOSIS — F330 Major depressive disorder, recurrent, mild: Secondary | ICD-10-CM

## 2022-08-15 DIAGNOSIS — R1310 Dysphagia, unspecified: Secondary | ICD-10-CM

## 2022-08-15 DIAGNOSIS — L89154 Pressure ulcer of sacral region, stage 4: Secondary | ICD-10-CM

## 2022-08-15 DIAGNOSIS — N390 Urinary tract infection, site not specified: Secondary | ICD-10-CM

## 2022-08-15 DIAGNOSIS — G8250 Quadriplegia, unspecified: Secondary | ICD-10-CM

## 2022-08-15 DIAGNOSIS — F330 Major depressive disorder, recurrent, mild: Secondary | ICD-10-CM

## 2022-08-15 DIAGNOSIS — J9601 Acute respiratory failure with hypoxia: Secondary | ICD-10-CM

## 2022-08-15 DIAGNOSIS — R509 Fever, unspecified: Secondary | ICD-10-CM

## 2022-08-15 DIAGNOSIS — R5381 Other malaise: Secondary | ICD-10-CM

## 2022-08-15 DIAGNOSIS — G4733 Obstructive sleep apnea (adult) (pediatric): Secondary | ICD-10-CM

## 2022-08-27 ENCOUNTER — Other Ambulatory Visit: Payer: Self-pay | Admitting: Internal Medicine

## 2022-08-27 DIAGNOSIS — G4733 Obstructive sleep apnea (adult) (pediatric): Secondary | ICD-10-CM | POA: Diagnosis not present

## 2022-08-27 DIAGNOSIS — G8250 Quadriplegia, unspecified: Secondary | ICD-10-CM

## 2022-08-27 DIAGNOSIS — R1310 Dysphagia, unspecified: Secondary | ICD-10-CM

## 2022-08-27 DIAGNOSIS — F330 Major depressive disorder, recurrent, mild: Secondary | ICD-10-CM

## 2022-08-27 DIAGNOSIS — R5381 Other malaise: Secondary | ICD-10-CM

## 2022-08-27 DIAGNOSIS — N390 Urinary tract infection, site not specified: Secondary | ICD-10-CM

## 2022-08-27 DIAGNOSIS — J9601 Acute respiratory failure with hypoxia: Secondary | ICD-10-CM

## 2022-08-27 DIAGNOSIS — L89154 Pressure ulcer of sacral region, stage 4: Secondary | ICD-10-CM

## 2022-08-27 DIAGNOSIS — J150 Pneumonia due to Klebsiella pneumoniae: Secondary | ICD-10-CM

## 2022-09-02 NOTE — Telephone Encounter (Signed)
Unknown to provider

## 2022-09-10 DIAGNOSIS — L89154 Pressure ulcer of sacral region, stage 4: Secondary | ICD-10-CM | POA: Diagnosis not present

## 2022-09-10 DIAGNOSIS — F330 Major depressive disorder, recurrent, mild: Secondary | ICD-10-CM

## 2022-09-10 DIAGNOSIS — G8250 Quadriplegia, unspecified: Secondary | ICD-10-CM

## 2022-09-10 DIAGNOSIS — N390 Urinary tract infection, site not specified: Secondary | ICD-10-CM | POA: Diagnosis not present

## 2022-09-10 DIAGNOSIS — R5381 Other malaise: Secondary | ICD-10-CM

## 2022-09-10 DIAGNOSIS — G4733 Obstructive sleep apnea (adult) (pediatric): Secondary | ICD-10-CM | POA: Diagnosis not present

## 2022-09-10 DIAGNOSIS — R1310 Dysphagia, unspecified: Secondary | ICD-10-CM

## 2022-09-10 DIAGNOSIS — J9601 Acute respiratory failure with hypoxia: Secondary | ICD-10-CM | POA: Diagnosis not present

## 2022-09-11 DIAGNOSIS — R001 Bradycardia, unspecified: Secondary | ICD-10-CM

## 2022-09-15 DIAGNOSIS — R509 Fever, unspecified: Secondary | ICD-10-CM

## 2022-09-15 DIAGNOSIS — J9601 Acute respiratory failure with hypoxia: Secondary | ICD-10-CM

## 2022-09-18 ENCOUNTER — Other Ambulatory Visit: Payer: Self-pay | Admitting: Internal Medicine

## 2022-09-24 DIAGNOSIS — R1310 Dysphagia, unspecified: Secondary | ICD-10-CM | POA: Diagnosis not present

## 2022-09-24 DIAGNOSIS — L89154 Pressure ulcer of sacral region, stage 4: Secondary | ICD-10-CM | POA: Diagnosis not present

## 2022-09-24 DIAGNOSIS — B49 Unspecified mycosis: Secondary | ICD-10-CM | POA: Diagnosis not present

## 2022-09-24 DIAGNOSIS — I5042 Chronic combined systolic (congestive) and diastolic (congestive) heart failure: Secondary | ICD-10-CM

## 2022-09-24 DIAGNOSIS — J9621 Acute and chronic respiratory failure with hypoxia: Secondary | ICD-10-CM | POA: Diagnosis not present

## 2022-09-26 DIAGNOSIS — B49 Unspecified mycosis: Secondary | ICD-10-CM

## 2022-10-08 DIAGNOSIS — R1310 Dysphagia, unspecified: Secondary | ICD-10-CM | POA: Diagnosis not present

## 2022-10-08 DIAGNOSIS — E119 Type 2 diabetes mellitus without complications: Secondary | ICD-10-CM | POA: Diagnosis not present

## 2022-10-08 DIAGNOSIS — J9621 Acute and chronic respiratory failure with hypoxia: Secondary | ICD-10-CM | POA: Diagnosis not present

## 2022-10-08 DIAGNOSIS — L89154 Pressure ulcer of sacral region, stage 4: Secondary | ICD-10-CM | POA: Diagnosis not present

## 2022-10-08 DIAGNOSIS — G8250 Quadriplegia, unspecified: Secondary | ICD-10-CM

## 2022-10-08 DIAGNOSIS — E662 Morbid (severe) obesity with alveolar hypoventilation: Secondary | ICD-10-CM

## 2022-10-08 DIAGNOSIS — I5042 Chronic combined systolic (congestive) and diastolic (congestive) heart failure: Secondary | ICD-10-CM

## 2022-10-10 ENCOUNTER — Other Ambulatory Visit: Payer: Self-pay | Admitting: Internal Medicine

## 2022-10-10 MED ORDER — METHADONE HCL 10 MG PO TABS: 10.0000 mg | ORAL_TABLET | Freq: Every day | ORAL | 0 refills | Status: DC

## 2022-10-22 DIAGNOSIS — L89154 Pressure ulcer of sacral region, stage 4: Secondary | ICD-10-CM | POA: Diagnosis not present

## 2022-10-22 DIAGNOSIS — R1310 Dysphagia, unspecified: Secondary | ICD-10-CM | POA: Diagnosis not present

## 2022-10-22 DIAGNOSIS — E662 Morbid (severe) obesity with alveolar hypoventilation: Secondary | ICD-10-CM

## 2022-10-22 DIAGNOSIS — J9621 Acute and chronic respiratory failure with hypoxia: Secondary | ICD-10-CM | POA: Diagnosis not present

## 2022-10-22 DIAGNOSIS — I5042 Chronic combined systolic (congestive) and diastolic (congestive) heart failure: Secondary | ICD-10-CM

## 2022-10-22 DIAGNOSIS — E119 Type 2 diabetes mellitus without complications: Secondary | ICD-10-CM | POA: Diagnosis not present

## 2022-10-22 DIAGNOSIS — G8250 Quadriplegia, unspecified: Secondary | ICD-10-CM

## 2022-10-28 DIAGNOSIS — R001 Bradycardia, unspecified: Secondary | ICD-10-CM | POA: Diagnosis not present

## 2022-10-29 ENCOUNTER — Other Ambulatory Visit: Payer: Self-pay | Admitting: Internal Medicine

## 2022-11-05 DIAGNOSIS — L89154 Pressure ulcer of sacral region, stage 4: Secondary | ICD-10-CM

## 2022-11-05 DIAGNOSIS — J9621 Acute and chronic respiratory failure with hypoxia: Secondary | ICD-10-CM

## 2022-11-05 DIAGNOSIS — E662 Morbid (severe) obesity with alveolar hypoventilation: Secondary | ICD-10-CM

## 2022-11-05 DIAGNOSIS — G8250 Quadriplegia, unspecified: Secondary | ICD-10-CM

## 2022-11-05 DIAGNOSIS — E119 Type 2 diabetes mellitus without complications: Secondary | ICD-10-CM | POA: Diagnosis not present

## 2022-11-05 DIAGNOSIS — R1310 Dysphagia, unspecified: Secondary | ICD-10-CM

## 2022-11-05 DIAGNOSIS — I5042 Chronic combined systolic (congestive) and diastolic (congestive) heart failure: Secondary | ICD-10-CM

## 2022-11-12 ENCOUNTER — Other Ambulatory Visit: Payer: Self-pay | Admitting: Internal Medicine

## 2022-11-19 DIAGNOSIS — J9621 Acute and chronic respiratory failure with hypoxia: Secondary | ICD-10-CM | POA: Diagnosis not present

## 2022-11-19 DIAGNOSIS — I5042 Chronic combined systolic (congestive) and diastolic (congestive) heart failure: Secondary | ICD-10-CM

## 2022-11-19 DIAGNOSIS — E119 Type 2 diabetes mellitus without complications: Secondary | ICD-10-CM | POA: Diagnosis not present

## 2022-11-19 DIAGNOSIS — E662 Morbid (severe) obesity with alveolar hypoventilation: Secondary | ICD-10-CM

## 2022-11-19 DIAGNOSIS — R1310 Dysphagia, unspecified: Secondary | ICD-10-CM

## 2022-11-19 DIAGNOSIS — G8250 Quadriplegia, unspecified: Secondary | ICD-10-CM

## 2022-11-19 DIAGNOSIS — L89154 Pressure ulcer of sacral region, stage 4: Secondary | ICD-10-CM

## 2022-12-03 DIAGNOSIS — I5042 Chronic combined systolic (congestive) and diastolic (congestive) heart failure: Secondary | ICD-10-CM

## 2022-12-03 DIAGNOSIS — E662 Morbid (severe) obesity with alveolar hypoventilation: Secondary | ICD-10-CM

## 2022-12-03 DIAGNOSIS — L89154 Pressure ulcer of sacral region, stage 4: Secondary | ICD-10-CM | POA: Diagnosis not present

## 2022-12-03 DIAGNOSIS — R1310 Dysphagia, unspecified: Secondary | ICD-10-CM | POA: Diagnosis not present

## 2022-12-03 DIAGNOSIS — E119 Type 2 diabetes mellitus without complications: Secondary | ICD-10-CM | POA: Diagnosis not present

## 2022-12-03 DIAGNOSIS — G8250 Quadriplegia, unspecified: Secondary | ICD-10-CM

## 2022-12-03 DIAGNOSIS — J9621 Acute and chronic respiratory failure with hypoxia: Secondary | ICD-10-CM | POA: Diagnosis not present

## 2022-12-04 ENCOUNTER — Other Ambulatory Visit: Payer: Self-pay | Admitting: Internal Medicine

## 2022-12-04 MED ORDER — OXYCODONE HCL 10 MG PO TABS: ORAL_TABLET | ORAL | 0 refills | Status: AC

## 2022-12-04 MED ORDER — METHADONE HCL 10 MG PO TABS: 10.0000 mg | ORAL_TABLET | Freq: Every day | ORAL | 0 refills | Status: DC

## 2022-12-04 MED ORDER — CLONAZEPAM 0.5 MG PO TABS: 0.5000 mg | ORAL_TABLET | Freq: Every day | ORAL | 2 refills | Status: DC

## 2022-12-12 ENCOUNTER — Other Ambulatory Visit: Payer: Self-pay | Admitting: Internal Medicine

## 2022-12-17 DIAGNOSIS — G8250 Quadriplegia, unspecified: Secondary | ICD-10-CM

## 2022-12-17 DIAGNOSIS — I5042 Chronic combined systolic (congestive) and diastolic (congestive) heart failure: Secondary | ICD-10-CM

## 2022-12-17 DIAGNOSIS — L89154 Pressure ulcer of sacral region, stage 4: Secondary | ICD-10-CM | POA: Diagnosis not present

## 2022-12-17 DIAGNOSIS — R1310 Dysphagia, unspecified: Secondary | ICD-10-CM | POA: Diagnosis not present

## 2022-12-17 DIAGNOSIS — E119 Type 2 diabetes mellitus without complications: Secondary | ICD-10-CM | POA: Diagnosis not present

## 2022-12-17 DIAGNOSIS — J9621 Acute and chronic respiratory failure with hypoxia: Secondary | ICD-10-CM | POA: Diagnosis not present

## 2022-12-17 DIAGNOSIS — E662 Morbid (severe) obesity with alveolar hypoventilation: Secondary | ICD-10-CM

## 2022-12-31 DIAGNOSIS — E662 Morbid (severe) obesity with alveolar hypoventilation: Secondary | ICD-10-CM

## 2022-12-31 DIAGNOSIS — E119 Type 2 diabetes mellitus without complications: Secondary | ICD-10-CM | POA: Diagnosis not present

## 2022-12-31 DIAGNOSIS — J9621 Acute and chronic respiratory failure with hypoxia: Secondary | ICD-10-CM | POA: Diagnosis not present

## 2022-12-31 DIAGNOSIS — L89154 Pressure ulcer of sacral region, stage 4: Secondary | ICD-10-CM | POA: Diagnosis not present

## 2022-12-31 DIAGNOSIS — I5042 Chronic combined systolic (congestive) and diastolic (congestive) heart failure: Secondary | ICD-10-CM

## 2022-12-31 DIAGNOSIS — R1310 Dysphagia, unspecified: Secondary | ICD-10-CM | POA: Diagnosis not present

## 2022-12-31 DIAGNOSIS — G8250 Quadriplegia, unspecified: Secondary | ICD-10-CM

## 2023-01-14 DIAGNOSIS — I5042 Chronic combined systolic (congestive) and diastolic (congestive) heart failure: Secondary | ICD-10-CM

## 2023-01-14 DIAGNOSIS — J9621 Acute and chronic respiratory failure with hypoxia: Secondary | ICD-10-CM | POA: Diagnosis not present

## 2023-01-14 DIAGNOSIS — L89154 Pressure ulcer of sacral region, stage 4: Secondary | ICD-10-CM | POA: Diagnosis not present

## 2023-01-14 DIAGNOSIS — E662 Morbid (severe) obesity with alveolar hypoventilation: Secondary | ICD-10-CM

## 2023-01-14 DIAGNOSIS — G8250 Quadriplegia, unspecified: Secondary | ICD-10-CM

## 2023-01-14 DIAGNOSIS — R1310 Dysphagia, unspecified: Secondary | ICD-10-CM | POA: Diagnosis not present

## 2023-01-14 DIAGNOSIS — E119 Type 2 diabetes mellitus without complications: Secondary | ICD-10-CM | POA: Diagnosis not present

## 2023-01-21 ENCOUNTER — Other Ambulatory Visit: Payer: Self-pay | Admitting: Internal Medicine

## 2023-01-28 DIAGNOSIS — L89154 Pressure ulcer of sacral region, stage 4: Secondary | ICD-10-CM | POA: Diagnosis not present

## 2023-01-28 DIAGNOSIS — E119 Type 2 diabetes mellitus without complications: Secondary | ICD-10-CM | POA: Diagnosis not present

## 2023-01-28 DIAGNOSIS — J9621 Acute and chronic respiratory failure with hypoxia: Secondary | ICD-10-CM | POA: Diagnosis not present

## 2023-01-28 DIAGNOSIS — E662 Morbid (severe) obesity with alveolar hypoventilation: Secondary | ICD-10-CM

## 2023-01-28 DIAGNOSIS — G8250 Quadriplegia, unspecified: Secondary | ICD-10-CM

## 2023-01-28 DIAGNOSIS — R1310 Dysphagia, unspecified: Secondary | ICD-10-CM | POA: Diagnosis not present

## 2023-01-28 DIAGNOSIS — I5042 Chronic combined systolic (congestive) and diastolic (congestive) heart failure: Secondary | ICD-10-CM

## 2023-02-03 DIAGNOSIS — J690 Pneumonitis due to inhalation of food and vomit: Secondary | ICD-10-CM

## 2023-02-03 DIAGNOSIS — R1310 Dysphagia, unspecified: Secondary | ICD-10-CM | POA: Diagnosis not present

## 2023-02-03 DIAGNOSIS — L89154 Pressure ulcer of sacral region, stage 4: Secondary | ICD-10-CM | POA: Diagnosis not present

## 2023-02-03 DIAGNOSIS — E119 Type 2 diabetes mellitus without complications: Secondary | ICD-10-CM | POA: Diagnosis not present

## 2023-02-03 DIAGNOSIS — G8250 Quadriplegia, unspecified: Secondary | ICD-10-CM

## 2023-02-03 DIAGNOSIS — I5042 Chronic combined systolic (congestive) and diastolic (congestive) heart failure: Secondary | ICD-10-CM

## 2023-02-03 DIAGNOSIS — J9621 Acute and chronic respiratory failure with hypoxia: Secondary | ICD-10-CM | POA: Diagnosis not present

## 2023-02-03 DIAGNOSIS — E662 Morbid (severe) obesity with alveolar hypoventilation: Secondary | ICD-10-CM

## 2023-02-11 DIAGNOSIS — J9621 Acute and chronic respiratory failure with hypoxia: Secondary | ICD-10-CM | POA: Diagnosis not present

## 2023-02-11 DIAGNOSIS — L89154 Pressure ulcer of sacral region, stage 4: Secondary | ICD-10-CM | POA: Diagnosis not present

## 2023-02-11 DIAGNOSIS — I5042 Chronic combined systolic (congestive) and diastolic (congestive) heart failure: Secondary | ICD-10-CM

## 2023-02-11 DIAGNOSIS — E119 Type 2 diabetes mellitus without complications: Secondary | ICD-10-CM | POA: Diagnosis not present

## 2023-02-11 DIAGNOSIS — G8250 Quadriplegia, unspecified: Secondary | ICD-10-CM

## 2023-02-11 DIAGNOSIS — E662 Morbid (severe) obesity with alveolar hypoventilation: Secondary | ICD-10-CM

## 2023-02-11 DIAGNOSIS — R1310 Dysphagia, unspecified: Secondary | ICD-10-CM | POA: Diagnosis not present

## 2023-02-18 ENCOUNTER — Other Ambulatory Visit: Payer: Self-pay | Admitting: Internal Medicine

## 2023-02-25 DIAGNOSIS — G8250 Quadriplegia, unspecified: Secondary | ICD-10-CM

## 2023-02-25 DIAGNOSIS — E662 Morbid (severe) obesity with alveolar hypoventilation: Secondary | ICD-10-CM

## 2023-02-25 DIAGNOSIS — L89154 Pressure ulcer of sacral region, stage 4: Secondary | ICD-10-CM | POA: Diagnosis not present

## 2023-02-25 DIAGNOSIS — E119 Type 2 diabetes mellitus without complications: Secondary | ICD-10-CM | POA: Diagnosis not present

## 2023-02-25 DIAGNOSIS — I5042 Chronic combined systolic (congestive) and diastolic (congestive) heart failure: Secondary | ICD-10-CM

## 2023-02-25 DIAGNOSIS — J9621 Acute and chronic respiratory failure with hypoxia: Secondary | ICD-10-CM | POA: Diagnosis not present

## 2023-02-25 DIAGNOSIS — R1310 Dysphagia, unspecified: Secondary | ICD-10-CM | POA: Diagnosis not present

## 2023-03-11 ENCOUNTER — Other Ambulatory Visit: Payer: Self-pay | Admitting: Internal Medicine

## 2023-03-11 DIAGNOSIS — J9621 Acute and chronic respiratory failure with hypoxia: Secondary | ICD-10-CM | POA: Diagnosis not present

## 2023-03-11 DIAGNOSIS — L89154 Pressure ulcer of sacral region, stage 4: Secondary | ICD-10-CM | POA: Diagnosis not present

## 2023-03-11 DIAGNOSIS — I5042 Chronic combined systolic (congestive) and diastolic (congestive) heart failure: Secondary | ICD-10-CM

## 2023-03-11 DIAGNOSIS — E662 Morbid (severe) obesity with alveolar hypoventilation: Secondary | ICD-10-CM

## 2023-03-11 DIAGNOSIS — E119 Type 2 diabetes mellitus without complications: Secondary | ICD-10-CM | POA: Diagnosis not present

## 2023-03-11 DIAGNOSIS — R1310 Dysphagia, unspecified: Secondary | ICD-10-CM | POA: Diagnosis not present

## 2023-03-11 DIAGNOSIS — G8250 Quadriplegia, unspecified: Secondary | ICD-10-CM

## 2023-03-11 MED ORDER — METHADONE HCL 10 MG PO TABS: 10.0000 mg | ORAL_TABLET | Freq: Every day | ORAL | 0 refills | Status: DC

## 2023-03-18 ENCOUNTER — Other Ambulatory Visit: Payer: Self-pay | Admitting: Internal Medicine

## 2023-03-21 ENCOUNTER — Other Ambulatory Visit: Payer: Self-pay | Admitting: Internal Medicine

## 2023-03-21 MED ORDER — CLONAZEPAM 0.5 MG PO TABS: 0.5000 mg | ORAL_TABLET | Freq: Every day | ORAL | 2 refills | Status: AC

## 2023-03-25 DIAGNOSIS — G8250 Quadriplegia, unspecified: Secondary | ICD-10-CM

## 2023-03-25 DIAGNOSIS — R1310 Dysphagia, unspecified: Secondary | ICD-10-CM | POA: Diagnosis not present

## 2023-03-25 DIAGNOSIS — E662 Morbid (severe) obesity with alveolar hypoventilation: Secondary | ICD-10-CM

## 2023-03-25 DIAGNOSIS — E119 Type 2 diabetes mellitus without complications: Secondary | ICD-10-CM | POA: Diagnosis not present

## 2023-03-25 DIAGNOSIS — J9621 Acute and chronic respiratory failure with hypoxia: Secondary | ICD-10-CM | POA: Diagnosis not present

## 2023-03-25 DIAGNOSIS — L89154 Pressure ulcer of sacral region, stage 4: Secondary | ICD-10-CM | POA: Diagnosis not present

## 2023-03-25 DIAGNOSIS — I5042 Chronic combined systolic (congestive) and diastolic (congestive) heart failure: Secondary | ICD-10-CM

## 2023-04-08 DIAGNOSIS — J9621 Acute and chronic respiratory failure with hypoxia: Secondary | ICD-10-CM | POA: Diagnosis not present

## 2023-04-08 DIAGNOSIS — L89154 Pressure ulcer of sacral region, stage 4: Secondary | ICD-10-CM | POA: Diagnosis not present

## 2023-04-08 DIAGNOSIS — R1310 Dysphagia, unspecified: Secondary | ICD-10-CM | POA: Diagnosis not present

## 2023-04-08 DIAGNOSIS — G8250 Quadriplegia, unspecified: Secondary | ICD-10-CM

## 2023-04-08 DIAGNOSIS — E119 Type 2 diabetes mellitus without complications: Secondary | ICD-10-CM | POA: Diagnosis not present

## 2023-04-08 DIAGNOSIS — E662 Morbid (severe) obesity with alveolar hypoventilation: Secondary | ICD-10-CM

## 2023-04-08 DIAGNOSIS — I5042 Chronic combined systolic (congestive) and diastolic (congestive) heart failure: Secondary | ICD-10-CM

## 2023-04-15 ENCOUNTER — Other Ambulatory Visit: Payer: Self-pay | Admitting: Internal Medicine

## 2023-04-22 ENCOUNTER — Other Ambulatory Visit: Payer: Self-pay | Admitting: Internal Medicine

## 2023-05-06 DIAGNOSIS — E662 Morbid (severe) obesity with alveolar hypoventilation: Secondary | ICD-10-CM

## 2023-05-06 DIAGNOSIS — R1310 Dysphagia, unspecified: Secondary | ICD-10-CM | POA: Diagnosis not present

## 2023-05-06 DIAGNOSIS — I5042 Chronic combined systolic (congestive) and diastolic (congestive) heart failure: Secondary | ICD-10-CM

## 2023-05-06 DIAGNOSIS — E119 Type 2 diabetes mellitus without complications: Secondary | ICD-10-CM | POA: Diagnosis not present

## 2023-05-06 DIAGNOSIS — L89154 Pressure ulcer of sacral region, stage 4: Secondary | ICD-10-CM | POA: Diagnosis not present

## 2023-05-06 DIAGNOSIS — J9621 Acute and chronic respiratory failure with hypoxia: Secondary | ICD-10-CM | POA: Diagnosis not present

## 2023-05-06 DIAGNOSIS — G8250 Quadriplegia, unspecified: Secondary | ICD-10-CM

## 2023-05-20 DIAGNOSIS — I5042 Chronic combined systolic (congestive) and diastolic (congestive) heart failure: Secondary | ICD-10-CM

## 2023-05-20 DIAGNOSIS — E119 Type 2 diabetes mellitus without complications: Secondary | ICD-10-CM | POA: Diagnosis not present

## 2023-05-20 DIAGNOSIS — E662 Morbid (severe) obesity with alveolar hypoventilation: Secondary | ICD-10-CM

## 2023-05-20 DIAGNOSIS — G8250 Quadriplegia, unspecified: Secondary | ICD-10-CM

## 2023-05-20 DIAGNOSIS — L89154 Pressure ulcer of sacral region, stage 4: Secondary | ICD-10-CM | POA: Diagnosis not present

## 2023-05-20 DIAGNOSIS — J9621 Acute and chronic respiratory failure with hypoxia: Secondary | ICD-10-CM | POA: Diagnosis not present

## 2023-05-20 DIAGNOSIS — R1310 Dysphagia, unspecified: Secondary | ICD-10-CM | POA: Diagnosis not present

## 2023-05-27 ENCOUNTER — Other Ambulatory Visit: Payer: Self-pay | Admitting: Internal Medicine

## 2023-06-03 DIAGNOSIS — G8250 Quadriplegia, unspecified: Secondary | ICD-10-CM

## 2023-06-03 DIAGNOSIS — L89154 Pressure ulcer of sacral region, stage 4: Secondary | ICD-10-CM | POA: Diagnosis not present

## 2023-06-03 DIAGNOSIS — R1310 Dysphagia, unspecified: Secondary | ICD-10-CM | POA: Diagnosis not present

## 2023-06-03 DIAGNOSIS — I5042 Chronic combined systolic (congestive) and diastolic (congestive) heart failure: Secondary | ICD-10-CM

## 2023-06-03 DIAGNOSIS — J9621 Acute and chronic respiratory failure with hypoxia: Secondary | ICD-10-CM | POA: Diagnosis not present

## 2023-06-03 DIAGNOSIS — E662 Morbid (severe) obesity with alveolar hypoventilation: Secondary | ICD-10-CM

## 2023-06-03 DIAGNOSIS — E119 Type 2 diabetes mellitus without complications: Secondary | ICD-10-CM | POA: Diagnosis not present

## 2023-06-17 DIAGNOSIS — L89154 Pressure ulcer of sacral region, stage 4: Secondary | ICD-10-CM | POA: Diagnosis not present

## 2023-06-17 DIAGNOSIS — J9621 Acute and chronic respiratory failure with hypoxia: Secondary | ICD-10-CM | POA: Diagnosis not present

## 2023-06-17 DIAGNOSIS — G8250 Quadriplegia, unspecified: Secondary | ICD-10-CM

## 2023-06-17 DIAGNOSIS — E662 Morbid (severe) obesity with alveolar hypoventilation: Secondary | ICD-10-CM

## 2023-06-17 DIAGNOSIS — E119 Type 2 diabetes mellitus without complications: Secondary | ICD-10-CM | POA: Diagnosis not present

## 2023-06-17 DIAGNOSIS — R1310 Dysphagia, unspecified: Secondary | ICD-10-CM | POA: Diagnosis not present

## 2023-06-17 DIAGNOSIS — I5042 Chronic combined systolic (congestive) and diastolic (congestive) heart failure: Secondary | ICD-10-CM

## 2023-06-24 ENCOUNTER — Other Ambulatory Visit: Payer: Self-pay | Admitting: Internal Medicine

## 2023-07-01 DIAGNOSIS — I5042 Chronic combined systolic (congestive) and diastolic (congestive) heart failure: Secondary | ICD-10-CM

## 2023-07-01 DIAGNOSIS — E119 Type 2 diabetes mellitus without complications: Secondary | ICD-10-CM | POA: Diagnosis not present

## 2023-07-01 DIAGNOSIS — R1310 Dysphagia, unspecified: Secondary | ICD-10-CM | POA: Diagnosis not present

## 2023-07-01 DIAGNOSIS — G8250 Quadriplegia, unspecified: Secondary | ICD-10-CM

## 2023-07-01 DIAGNOSIS — J9621 Acute and chronic respiratory failure with hypoxia: Secondary | ICD-10-CM | POA: Diagnosis not present

## 2023-07-01 DIAGNOSIS — L89154 Pressure ulcer of sacral region, stage 4: Secondary | ICD-10-CM | POA: Diagnosis not present

## 2023-07-01 DIAGNOSIS — E662 Morbid (severe) obesity with alveolar hypoventilation: Secondary | ICD-10-CM

## 2023-07-15 DIAGNOSIS — J9621 Acute and chronic respiratory failure with hypoxia: Secondary | ICD-10-CM | POA: Diagnosis not present

## 2023-07-15 DIAGNOSIS — N390 Urinary tract infection, site not specified: Secondary | ICD-10-CM | POA: Diagnosis not present

## 2023-07-29 ENCOUNTER — Other Ambulatory Visit: Payer: Self-pay | Admitting: Internal Medicine

## 2023-07-29 DIAGNOSIS — N390 Urinary tract infection, site not specified: Secondary | ICD-10-CM

## 2023-07-29 DIAGNOSIS — J9621 Acute and chronic respiratory failure with hypoxia: Secondary | ICD-10-CM

## 2023-08-12 DIAGNOSIS — N390 Urinary tract infection, site not specified: Secondary | ICD-10-CM | POA: Diagnosis not present

## 2023-08-12 DIAGNOSIS — J9621 Acute and chronic respiratory failure with hypoxia: Secondary | ICD-10-CM | POA: Diagnosis not present

## 2023-08-15 DIAGNOSIS — R001 Bradycardia, unspecified: Secondary | ICD-10-CM | POA: Diagnosis not present

## 2023-08-21 ENCOUNTER — Other Ambulatory Visit: Payer: Self-pay | Admitting: Internal Medicine

## 2023-08-26 DIAGNOSIS — J9621 Acute and chronic respiratory failure with hypoxia: Secondary | ICD-10-CM | POA: Diagnosis not present

## 2023-08-26 DIAGNOSIS — E662 Morbid (severe) obesity with alveolar hypoventilation: Secondary | ICD-10-CM | POA: Diagnosis not present

## 2023-08-26 DIAGNOSIS — F331 Major depressive disorder, recurrent, moderate: Secondary | ICD-10-CM | POA: Diagnosis not present

## 2023-08-26 DIAGNOSIS — G8250 Quadriplegia, unspecified: Secondary | ICD-10-CM

## 2023-08-26 DIAGNOSIS — I5042 Chronic combined systolic (congestive) and diastolic (congestive) heart failure: Secondary | ICD-10-CM

## 2023-08-26 DIAGNOSIS — R1310 Dysphagia, unspecified: Secondary | ICD-10-CM | POA: Diagnosis not present

## 2023-09-02 ENCOUNTER — Other Ambulatory Visit: Payer: Self-pay | Admitting: Internal Medicine

## 2023-09-09 DIAGNOSIS — E662 Morbid (severe) obesity with alveolar hypoventilation: Secondary | ICD-10-CM

## 2023-09-09 DIAGNOSIS — R1310 Dysphagia, unspecified: Secondary | ICD-10-CM | POA: Diagnosis not present

## 2023-09-09 DIAGNOSIS — F331 Major depressive disorder, recurrent, moderate: Secondary | ICD-10-CM | POA: Diagnosis not present

## 2023-09-09 DIAGNOSIS — J9621 Acute and chronic respiratory failure with hypoxia: Secondary | ICD-10-CM | POA: Diagnosis not present

## 2023-09-09 DIAGNOSIS — I5042 Chronic combined systolic (congestive) and diastolic (congestive) heart failure: Secondary | ICD-10-CM | POA: Diagnosis not present

## 2023-09-09 DIAGNOSIS — G8250 Quadriplegia, unspecified: Secondary | ICD-10-CM

## 2023-09-23 DIAGNOSIS — G8250 Quadriplegia, unspecified: Secondary | ICD-10-CM

## 2023-09-23 DIAGNOSIS — F331 Major depressive disorder, recurrent, moderate: Secondary | ICD-10-CM | POA: Diagnosis not present

## 2023-09-23 DIAGNOSIS — R1310 Dysphagia, unspecified: Secondary | ICD-10-CM | POA: Diagnosis not present

## 2023-09-23 DIAGNOSIS — J9621 Acute and chronic respiratory failure with hypoxia: Secondary | ICD-10-CM | POA: Diagnosis not present

## 2023-09-23 DIAGNOSIS — I5042 Chronic combined systolic (congestive) and diastolic (congestive) heart failure: Secondary | ICD-10-CM | POA: Diagnosis not present

## 2023-09-23 DIAGNOSIS — E662 Morbid (severe) obesity with alveolar hypoventilation: Secondary | ICD-10-CM

## 2023-10-07 DIAGNOSIS — G8250 Quadriplegia, unspecified: Secondary | ICD-10-CM

## 2023-10-07 DIAGNOSIS — F331 Major depressive disorder, recurrent, moderate: Secondary | ICD-10-CM | POA: Diagnosis not present

## 2023-10-07 DIAGNOSIS — R1310 Dysphagia, unspecified: Secondary | ICD-10-CM | POA: Diagnosis not present

## 2023-10-07 DIAGNOSIS — J9621 Acute and chronic respiratory failure with hypoxia: Secondary | ICD-10-CM | POA: Diagnosis not present

## 2023-10-07 DIAGNOSIS — E662 Morbid (severe) obesity with alveolar hypoventilation: Secondary | ICD-10-CM

## 2023-10-07 DIAGNOSIS — I5042 Chronic combined systolic (congestive) and diastolic (congestive) heart failure: Secondary | ICD-10-CM | POA: Diagnosis not present

## 2023-10-14 ENCOUNTER — Other Ambulatory Visit: Payer: Self-pay | Admitting: Internal Medicine

## 2023-10-21 DIAGNOSIS — G8250 Quadriplegia, unspecified: Secondary | ICD-10-CM

## 2023-10-21 DIAGNOSIS — F331 Major depressive disorder, recurrent, moderate: Secondary | ICD-10-CM | POA: Diagnosis not present

## 2023-10-21 DIAGNOSIS — R1310 Dysphagia, unspecified: Secondary | ICD-10-CM | POA: Diagnosis not present

## 2023-10-21 DIAGNOSIS — E662 Morbid (severe) obesity with alveolar hypoventilation: Secondary | ICD-10-CM

## 2023-10-21 DIAGNOSIS — J9621 Acute and chronic respiratory failure with hypoxia: Secondary | ICD-10-CM | POA: Diagnosis not present

## 2023-10-21 DIAGNOSIS — I5042 Chronic combined systolic (congestive) and diastolic (congestive) heart failure: Secondary | ICD-10-CM | POA: Diagnosis not present

## 2023-10-29 DIAGNOSIS — R1310 Dysphagia, unspecified: Secondary | ICD-10-CM | POA: Diagnosis not present

## 2023-10-29 DIAGNOSIS — I5042 Chronic combined systolic (congestive) and diastolic (congestive) heart failure: Secondary | ICD-10-CM | POA: Diagnosis not present

## 2023-10-29 DIAGNOSIS — G8250 Quadriplegia, unspecified: Secondary | ICD-10-CM

## 2023-10-29 DIAGNOSIS — J9621 Acute and chronic respiratory failure with hypoxia: Secondary | ICD-10-CM | POA: Diagnosis not present

## 2023-10-29 DIAGNOSIS — E662 Morbid (severe) obesity with alveolar hypoventilation: Secondary | ICD-10-CM

## 2023-10-29 DIAGNOSIS — F331 Major depressive disorder, recurrent, moderate: Secondary | ICD-10-CM | POA: Diagnosis not present

## 2023-11-04 DIAGNOSIS — F331 Major depressive disorder, recurrent, moderate: Secondary | ICD-10-CM | POA: Diagnosis not present

## 2023-11-04 DIAGNOSIS — G8250 Quadriplegia, unspecified: Secondary | ICD-10-CM

## 2023-11-04 DIAGNOSIS — E662 Morbid (severe) obesity with alveolar hypoventilation: Secondary | ICD-10-CM

## 2023-11-04 DIAGNOSIS — J9621 Acute and chronic respiratory failure with hypoxia: Secondary | ICD-10-CM | POA: Diagnosis not present

## 2023-11-04 DIAGNOSIS — R1310 Dysphagia, unspecified: Secondary | ICD-10-CM | POA: Diagnosis not present

## 2023-11-04 DIAGNOSIS — I5042 Chronic combined systolic (congestive) and diastolic (congestive) heart failure: Secondary | ICD-10-CM | POA: Diagnosis not present

## 2023-11-11 ENCOUNTER — Other Ambulatory Visit: Payer: Self-pay | Admitting: Internal Medicine

## 2023-11-11 DIAGNOSIS — I5042 Chronic combined systolic (congestive) and diastolic (congestive) heart failure: Secondary | ICD-10-CM | POA: Diagnosis not present

## 2023-11-11 DIAGNOSIS — E662 Morbid (severe) obesity with alveolar hypoventilation: Secondary | ICD-10-CM

## 2023-11-11 DIAGNOSIS — J9621 Acute and chronic respiratory failure with hypoxia: Secondary | ICD-10-CM | POA: Diagnosis not present

## 2023-11-11 DIAGNOSIS — R1310 Dysphagia, unspecified: Secondary | ICD-10-CM | POA: Diagnosis not present

## 2023-11-11 DIAGNOSIS — F331 Major depressive disorder, recurrent, moderate: Secondary | ICD-10-CM | POA: Diagnosis not present

## 2023-11-18 DIAGNOSIS — F331 Major depressive disorder, recurrent, moderate: Secondary | ICD-10-CM | POA: Diagnosis not present

## 2023-11-18 DIAGNOSIS — E662 Morbid (severe) obesity with alveolar hypoventilation: Secondary | ICD-10-CM

## 2023-11-18 DIAGNOSIS — R1310 Dysphagia, unspecified: Secondary | ICD-10-CM | POA: Diagnosis not present

## 2023-11-18 DIAGNOSIS — I5042 Chronic combined systolic (congestive) and diastolic (congestive) heart failure: Secondary | ICD-10-CM | POA: Diagnosis not present

## 2023-11-18 DIAGNOSIS — J9621 Acute and chronic respiratory failure with hypoxia: Secondary | ICD-10-CM | POA: Diagnosis not present

## 2023-11-25 DIAGNOSIS — R1310 Dysphagia, unspecified: Secondary | ICD-10-CM | POA: Diagnosis not present

## 2023-11-25 DIAGNOSIS — J9621 Acute and chronic respiratory failure with hypoxia: Secondary | ICD-10-CM | POA: Diagnosis not present

## 2023-11-25 DIAGNOSIS — G8250 Quadriplegia, unspecified: Secondary | ICD-10-CM

## 2023-11-25 DIAGNOSIS — I5042 Chronic combined systolic (congestive) and diastolic (congestive) heart failure: Secondary | ICD-10-CM | POA: Diagnosis not present

## 2023-11-25 DIAGNOSIS — E662 Morbid (severe) obesity with alveolar hypoventilation: Secondary | ICD-10-CM

## 2023-11-25 DIAGNOSIS — F331 Major depressive disorder, recurrent, moderate: Secondary | ICD-10-CM | POA: Diagnosis not present

## 2023-12-02 DIAGNOSIS — E662 Morbid (severe) obesity with alveolar hypoventilation: Secondary | ICD-10-CM

## 2023-12-02 DIAGNOSIS — F331 Major depressive disorder, recurrent, moderate: Secondary | ICD-10-CM | POA: Diagnosis not present

## 2023-12-02 DIAGNOSIS — J9621 Acute and chronic respiratory failure with hypoxia: Secondary | ICD-10-CM | POA: Diagnosis not present

## 2023-12-02 DIAGNOSIS — R1310 Dysphagia, unspecified: Secondary | ICD-10-CM | POA: Diagnosis not present

## 2023-12-02 DIAGNOSIS — I5042 Chronic combined systolic (congestive) and diastolic (congestive) heart failure: Secondary | ICD-10-CM | POA: Diagnosis not present

## 2023-12-09 ENCOUNTER — Other Ambulatory Visit: Payer: Self-pay | Admitting: Internal Medicine

## 2023-12-09 DIAGNOSIS — J189 Pneumonia, unspecified organism: Secondary | ICD-10-CM

## 2023-12-09 DIAGNOSIS — R1310 Dysphagia, unspecified: Secondary | ICD-10-CM | POA: Diagnosis not present

## 2023-12-09 DIAGNOSIS — E662 Morbid (severe) obesity with alveolar hypoventilation: Secondary | ICD-10-CM

## 2023-12-09 DIAGNOSIS — J9621 Acute and chronic respiratory failure with hypoxia: Secondary | ICD-10-CM | POA: Diagnosis not present

## 2023-12-09 DIAGNOSIS — G8250 Quadriplegia, unspecified: Secondary | ICD-10-CM

## 2023-12-09 DIAGNOSIS — I5042 Chronic combined systolic (congestive) and diastolic (congestive) heart failure: Secondary | ICD-10-CM | POA: Diagnosis not present

## 2023-12-09 DIAGNOSIS — F331 Major depressive disorder, recurrent, moderate: Secondary | ICD-10-CM | POA: Diagnosis not present

## 2023-12-16 DIAGNOSIS — I5042 Chronic combined systolic (congestive) and diastolic (congestive) heart failure: Secondary | ICD-10-CM | POA: Diagnosis not present

## 2023-12-16 DIAGNOSIS — J9621 Acute and chronic respiratory failure with hypoxia: Secondary | ICD-10-CM | POA: Diagnosis not present

## 2023-12-16 DIAGNOSIS — F331 Major depressive disorder, recurrent, moderate: Secondary | ICD-10-CM | POA: Diagnosis not present

## 2023-12-16 DIAGNOSIS — E662 Morbid (severe) obesity with alveolar hypoventilation: Secondary | ICD-10-CM

## 2023-12-16 DIAGNOSIS — G8250 Quadriplegia, unspecified: Secondary | ICD-10-CM

## 2023-12-16 DIAGNOSIS — J189 Pneumonia, unspecified organism: Secondary | ICD-10-CM | POA: Diagnosis not present

## 2023-12-16 DIAGNOSIS — R1310 Dysphagia, unspecified: Secondary | ICD-10-CM

## 2023-12-22 DIAGNOSIS — I5042 Chronic combined systolic (congestive) and diastolic (congestive) heart failure: Secondary | ICD-10-CM | POA: Diagnosis not present

## 2023-12-22 DIAGNOSIS — J9621 Acute and chronic respiratory failure with hypoxia: Secondary | ICD-10-CM | POA: Diagnosis not present

## 2023-12-22 DIAGNOSIS — E662 Morbid (severe) obesity with alveolar hypoventilation: Secondary | ICD-10-CM

## 2023-12-22 DIAGNOSIS — R1310 Dysphagia, unspecified: Secondary | ICD-10-CM

## 2023-12-22 DIAGNOSIS — F331 Major depressive disorder, recurrent, moderate: Secondary | ICD-10-CM | POA: Diagnosis not present

## 2023-12-22 DIAGNOSIS — J189 Pneumonia, unspecified organism: Secondary | ICD-10-CM | POA: Diagnosis not present

## 2023-12-22 DIAGNOSIS — G8250 Quadriplegia, unspecified: Secondary | ICD-10-CM

## 2023-12-30 DIAGNOSIS — R1310 Dysphagia, unspecified: Secondary | ICD-10-CM | POA: Diagnosis not present

## 2023-12-30 DIAGNOSIS — E662 Morbid (severe) obesity with alveolar hypoventilation: Secondary | ICD-10-CM

## 2023-12-30 DIAGNOSIS — F331 Major depressive disorder, recurrent, moderate: Secondary | ICD-10-CM | POA: Diagnosis not present

## 2023-12-30 DIAGNOSIS — I5042 Chronic combined systolic (congestive) and diastolic (congestive) heart failure: Secondary | ICD-10-CM | POA: Diagnosis not present

## 2023-12-30 DIAGNOSIS — J9621 Acute and chronic respiratory failure with hypoxia: Secondary | ICD-10-CM | POA: Diagnosis not present

## 2023-12-30 DIAGNOSIS — G8250 Quadriplegia, unspecified: Secondary | ICD-10-CM

## 2024-01-05 ENCOUNTER — Other Ambulatory Visit: Payer: Self-pay | Admitting: Internal Medicine

## 2024-01-05 MED ORDER — METHADONE HCL 10 MG PO TABS: 10.0000 mg | ORAL_TABLET | Freq: Every day | ORAL | 0 refills | Status: DC

## 2024-01-06 DIAGNOSIS — F331 Major depressive disorder, recurrent, moderate: Secondary | ICD-10-CM | POA: Diagnosis not present

## 2024-01-06 DIAGNOSIS — I5042 Chronic combined systolic (congestive) and diastolic (congestive) heart failure: Secondary | ICD-10-CM | POA: Diagnosis not present

## 2024-01-06 DIAGNOSIS — E662 Morbid (severe) obesity with alveolar hypoventilation: Secondary | ICD-10-CM

## 2024-01-06 DIAGNOSIS — R1310 Dysphagia, unspecified: Secondary | ICD-10-CM | POA: Diagnosis not present

## 2024-01-06 DIAGNOSIS — G8250 Quadriplegia, unspecified: Secondary | ICD-10-CM

## 2024-01-06 DIAGNOSIS — J9621 Acute and chronic respiratory failure with hypoxia: Secondary | ICD-10-CM | POA: Diagnosis not present

## 2024-01-13 DIAGNOSIS — G8250 Quadriplegia, unspecified: Secondary | ICD-10-CM | POA: Diagnosis not present

## 2024-01-13 DIAGNOSIS — I5042 Chronic combined systolic (congestive) and diastolic (congestive) heart failure: Secondary | ICD-10-CM | POA: Diagnosis not present

## 2024-01-13 DIAGNOSIS — J9621 Acute and chronic respiratory failure with hypoxia: Secondary | ICD-10-CM | POA: Diagnosis not present

## 2024-01-13 DIAGNOSIS — R1310 Dysphagia, unspecified: Secondary | ICD-10-CM | POA: Diagnosis not present

## 2024-01-13 DIAGNOSIS — E662 Morbid (severe) obesity with alveolar hypoventilation: Secondary | ICD-10-CM

## 2024-01-20 DIAGNOSIS — J9621 Acute and chronic respiratory failure with hypoxia: Secondary | ICD-10-CM | POA: Diagnosis not present

## 2024-01-20 DIAGNOSIS — G8250 Quadriplegia, unspecified: Secondary | ICD-10-CM

## 2024-01-20 DIAGNOSIS — I5042 Chronic combined systolic (congestive) and diastolic (congestive) heart failure: Secondary | ICD-10-CM | POA: Diagnosis not present

## 2024-01-20 DIAGNOSIS — R1310 Dysphagia, unspecified: Secondary | ICD-10-CM | POA: Diagnosis not present

## 2024-01-20 DIAGNOSIS — E662 Morbid (severe) obesity with alveolar hypoventilation: Secondary | ICD-10-CM

## 2024-01-20 DIAGNOSIS — F331 Major depressive disorder, recurrent, moderate: Secondary | ICD-10-CM | POA: Diagnosis not present

## 2024-01-27 DIAGNOSIS — F331 Major depressive disorder, recurrent, moderate: Secondary | ICD-10-CM | POA: Diagnosis not present

## 2024-01-27 DIAGNOSIS — R1310 Dysphagia, unspecified: Secondary | ICD-10-CM | POA: Diagnosis not present

## 2024-01-27 DIAGNOSIS — E662 Morbid (severe) obesity with alveolar hypoventilation: Secondary | ICD-10-CM

## 2024-01-27 DIAGNOSIS — G8250 Quadriplegia, unspecified: Secondary | ICD-10-CM

## 2024-01-27 DIAGNOSIS — J9621 Acute and chronic respiratory failure with hypoxia: Secondary | ICD-10-CM | POA: Diagnosis not present

## 2024-01-27 DIAGNOSIS — I5042 Chronic combined systolic (congestive) and diastolic (congestive) heart failure: Secondary | ICD-10-CM | POA: Diagnosis not present

## 2024-01-29 ENCOUNTER — Other Ambulatory Visit: Payer: Self-pay | Admitting: Internal Medicine

## 2024-01-29 MED ORDER — METHADONE HCL 10 MG PO TABS: 10.0000 mg | ORAL_TABLET | Freq: Every day | ORAL | 0 refills | Status: DC

## 2024-02-03 DIAGNOSIS — F331 Major depressive disorder, recurrent, moderate: Secondary | ICD-10-CM | POA: Diagnosis not present

## 2024-02-03 DIAGNOSIS — R1310 Dysphagia, unspecified: Secondary | ICD-10-CM | POA: Diagnosis not present

## 2024-02-03 DIAGNOSIS — J9621 Acute and chronic respiratory failure with hypoxia: Secondary | ICD-10-CM | POA: Diagnosis not present

## 2024-02-03 DIAGNOSIS — E662 Morbid (severe) obesity with alveolar hypoventilation: Secondary | ICD-10-CM

## 2024-02-03 DIAGNOSIS — I5042 Chronic combined systolic (congestive) and diastolic (congestive) heart failure: Secondary | ICD-10-CM | POA: Diagnosis not present

## 2024-02-10 DIAGNOSIS — I5042 Chronic combined systolic (congestive) and diastolic (congestive) heart failure: Secondary | ICD-10-CM | POA: Diagnosis not present

## 2024-02-10 DIAGNOSIS — J9621 Acute and chronic respiratory failure with hypoxia: Secondary | ICD-10-CM | POA: Diagnosis not present

## 2024-02-10 DIAGNOSIS — E662 Morbid (severe) obesity with alveolar hypoventilation: Secondary | ICD-10-CM | POA: Diagnosis not present

## 2024-02-10 DIAGNOSIS — R1310 Dysphagia, unspecified: Secondary | ICD-10-CM | POA: Diagnosis not present

## 2024-02-17 DIAGNOSIS — I5042 Chronic combined systolic (congestive) and diastolic (congestive) heart failure: Secondary | ICD-10-CM | POA: Diagnosis not present

## 2024-02-17 DIAGNOSIS — E662 Morbid (severe) obesity with alveolar hypoventilation: Secondary | ICD-10-CM | POA: Diagnosis not present

## 2024-02-17 DIAGNOSIS — F331 Major depressive disorder, recurrent, moderate: Secondary | ICD-10-CM | POA: Diagnosis not present

## 2024-02-17 DIAGNOSIS — J9621 Acute and chronic respiratory failure with hypoxia: Secondary | ICD-10-CM | POA: Diagnosis not present

## 2024-02-24 DIAGNOSIS — I5042 Chronic combined systolic (congestive) and diastolic (congestive) heart failure: Secondary | ICD-10-CM | POA: Diagnosis not present

## 2024-02-24 DIAGNOSIS — E662 Morbid (severe) obesity with alveolar hypoventilation: Secondary | ICD-10-CM | POA: Diagnosis not present

## 2024-02-24 DIAGNOSIS — J9621 Acute and chronic respiratory failure with hypoxia: Secondary | ICD-10-CM | POA: Diagnosis not present

## 2024-02-24 DIAGNOSIS — F331 Major depressive disorder, recurrent, moderate: Secondary | ICD-10-CM | POA: Diagnosis not present

## 2024-03-02 DIAGNOSIS — R1310 Dysphagia, unspecified: Secondary | ICD-10-CM | POA: Diagnosis not present

## 2024-03-02 DIAGNOSIS — I5042 Chronic combined systolic (congestive) and diastolic (congestive) heart failure: Secondary | ICD-10-CM | POA: Diagnosis not present

## 2024-03-02 DIAGNOSIS — F331 Major depressive disorder, recurrent, moderate: Secondary | ICD-10-CM | POA: Diagnosis not present

## 2024-03-02 DIAGNOSIS — G8250 Quadriplegia, unspecified: Secondary | ICD-10-CM

## 2024-03-02 DIAGNOSIS — J9621 Acute and chronic respiratory failure with hypoxia: Secondary | ICD-10-CM | POA: Diagnosis not present

## 2024-03-02 DIAGNOSIS — E662 Morbid (severe) obesity with alveolar hypoventilation: Secondary | ICD-10-CM

## 2024-03-09 ENCOUNTER — Other Ambulatory Visit: Payer: Self-pay | Admitting: Internal Medicine

## 2024-03-09 DIAGNOSIS — J9621 Acute and chronic respiratory failure with hypoxia: Secondary | ICD-10-CM | POA: Diagnosis not present

## 2024-03-09 DIAGNOSIS — E662 Morbid (severe) obesity with alveolar hypoventilation: Secondary | ICD-10-CM | POA: Diagnosis not present

## 2024-03-09 DIAGNOSIS — F331 Major depressive disorder, recurrent, moderate: Secondary | ICD-10-CM

## 2024-03-09 DIAGNOSIS — I5042 Chronic combined systolic (congestive) and diastolic (congestive) heart failure: Secondary | ICD-10-CM | POA: Diagnosis not present

## 2024-03-09 DIAGNOSIS — R1310 Dysphagia, unspecified: Secondary | ICD-10-CM | POA: Diagnosis not present

## 2024-03-09 DIAGNOSIS — G8250 Quadriplegia, unspecified: Secondary | ICD-10-CM

## 2024-03-16 DIAGNOSIS — G8250 Quadriplegia, unspecified: Secondary | ICD-10-CM

## 2024-03-16 DIAGNOSIS — J9621 Acute and chronic respiratory failure with hypoxia: Secondary | ICD-10-CM | POA: Diagnosis not present

## 2024-03-16 DIAGNOSIS — I5042 Chronic combined systolic (congestive) and diastolic (congestive) heart failure: Secondary | ICD-10-CM | POA: Diagnosis not present

## 2024-03-16 DIAGNOSIS — R1310 Dysphagia, unspecified: Secondary | ICD-10-CM | POA: Diagnosis not present

## 2024-03-16 DIAGNOSIS — F331 Major depressive disorder, recurrent, moderate: Secondary | ICD-10-CM | POA: Diagnosis not present

## 2024-03-16 DIAGNOSIS — E662 Morbid (severe) obesity with alveolar hypoventilation: Secondary | ICD-10-CM

## 2024-03-23 DIAGNOSIS — F331 Major depressive disorder, recurrent, moderate: Secondary | ICD-10-CM | POA: Diagnosis not present

## 2024-03-23 DIAGNOSIS — G8250 Quadriplegia, unspecified: Secondary | ICD-10-CM | POA: Diagnosis not present

## 2024-03-23 DIAGNOSIS — E662 Morbid (severe) obesity with alveolar hypoventilation: Secondary | ICD-10-CM

## 2024-03-23 DIAGNOSIS — I5042 Chronic combined systolic (congestive) and diastolic (congestive) heart failure: Secondary | ICD-10-CM | POA: Diagnosis not present

## 2024-03-23 DIAGNOSIS — R1310 Dysphagia, unspecified: Secondary | ICD-10-CM

## 2024-03-23 DIAGNOSIS — J9621 Acute and chronic respiratory failure with hypoxia: Secondary | ICD-10-CM | POA: Diagnosis not present

## 2024-03-30 DIAGNOSIS — I5042 Chronic combined systolic (congestive) and diastolic (congestive) heart failure: Secondary | ICD-10-CM | POA: Diagnosis not present

## 2024-03-30 DIAGNOSIS — R1310 Dysphagia, unspecified: Secondary | ICD-10-CM | POA: Diagnosis not present

## 2024-03-30 DIAGNOSIS — J9621 Acute and chronic respiratory failure with hypoxia: Secondary | ICD-10-CM | POA: Diagnosis not present

## 2024-03-30 DIAGNOSIS — E662 Morbid (severe) obesity with alveolar hypoventilation: Secondary | ICD-10-CM

## 2024-03-30 DIAGNOSIS — F331 Major depressive disorder, recurrent, moderate: Secondary | ICD-10-CM | POA: Diagnosis not present

## 2024-04-06 ENCOUNTER — Other Ambulatory Visit: Payer: Self-pay | Admitting: Internal Medicine

## 2024-04-06 DIAGNOSIS — E662 Morbid (severe) obesity with alveolar hypoventilation: Secondary | ICD-10-CM

## 2024-04-06 DIAGNOSIS — F331 Major depressive disorder, recurrent, moderate: Secondary | ICD-10-CM | POA: Diagnosis not present

## 2024-04-06 DIAGNOSIS — J9621 Acute and chronic respiratory failure with hypoxia: Secondary | ICD-10-CM | POA: Diagnosis not present

## 2024-04-06 DIAGNOSIS — R1310 Dysphagia, unspecified: Secondary | ICD-10-CM | POA: Diagnosis not present

## 2024-04-06 DIAGNOSIS — I5042 Chronic combined systolic (congestive) and diastolic (congestive) heart failure: Secondary | ICD-10-CM | POA: Diagnosis not present

## 2024-04-13 DIAGNOSIS — F331 Major depressive disorder, recurrent, moderate: Secondary | ICD-10-CM | POA: Diagnosis not present

## 2024-04-13 DIAGNOSIS — E662 Morbid (severe) obesity with alveolar hypoventilation: Secondary | ICD-10-CM

## 2024-04-13 DIAGNOSIS — I5042 Chronic combined systolic (congestive) and diastolic (congestive) heart failure: Secondary | ICD-10-CM | POA: Diagnosis not present

## 2024-04-13 DIAGNOSIS — R1310 Dysphagia, unspecified: Secondary | ICD-10-CM | POA: Diagnosis not present

## 2024-04-13 DIAGNOSIS — J9621 Acute and chronic respiratory failure with hypoxia: Secondary | ICD-10-CM | POA: Diagnosis not present

## 2024-05-11 ENCOUNTER — Other Ambulatory Visit: Payer: Self-pay | Admitting: Internal Medicine

## 2024-05-12 NOTE — Telephone Encounter (Signed)
Message given to dr Nelson Chimes

## 2024-05-26 ENCOUNTER — Other Ambulatory Visit: Payer: Self-pay | Admitting: Internal Medicine

## 2024-06-01 ENCOUNTER — Other Ambulatory Visit: Payer: Self-pay | Admitting: Internal Medicine

## 2024-07-06 ENCOUNTER — Other Ambulatory Visit: Payer: Self-pay | Admitting: Internal Medicine

## 2024-07-06 MED ORDER — METHADONE HCL 10 MG PO TABS: 10.0000 mg | ORAL_TABLET | Freq: Every day | ORAL | 0 refills | Status: AC
